# Patient Record
Sex: Female | Born: 1955 | Race: White | Hispanic: No | Marital: Married | Smoking: Former smoker
Health system: Southern US, Community
[De-identification: ages and names within clinical notes are randomized; demographics above are authoritative.]

## PROBLEM LIST (undated history)

## (undated) DIAGNOSIS — M549 Dorsalgia, unspecified: Secondary | ICD-10-CM

## (undated) DIAGNOSIS — K8020 Calculus of gallbladder without cholecystitis without obstruction: Secondary | ICD-10-CM

## (undated) DIAGNOSIS — F329 Major depressive disorder, single episode, unspecified: Secondary | ICD-10-CM

## (undated) DIAGNOSIS — D649 Anemia, unspecified: Secondary | ICD-10-CM

## (undated) DIAGNOSIS — E119 Type 2 diabetes mellitus without complications: Secondary | ICD-10-CM

## (undated) DIAGNOSIS — H269 Unspecified cataract: Secondary | ICD-10-CM

## (undated) DIAGNOSIS — K769 Liver disease, unspecified: Secondary | ICD-10-CM

## (undated) DIAGNOSIS — F32A Depression, unspecified: Secondary | ICD-10-CM

## (undated) DIAGNOSIS — N289 Disorder of kidney and ureter, unspecified: Secondary | ICD-10-CM

## (undated) DIAGNOSIS — H3320 Serous retinal detachment, unspecified eye: Secondary | ICD-10-CM

## (undated) DIAGNOSIS — E785 Hyperlipidemia, unspecified: Secondary | ICD-10-CM

## (undated) DIAGNOSIS — I10 Essential (primary) hypertension: Secondary | ICD-10-CM

## (undated) HISTORY — PX: EYE SURGERY: SHX253

## (undated) HISTORY — PX: ABDOMINAL HYSTERECTOMY: SHX81

## (undated) HISTORY — PX: ANTERIOR CRUCIATE LIGAMENT REPAIR: SHX115

## (undated) HISTORY — PX: KIDNEY STONE SURGERY: SHX686

---

## 2004-12-01 ENCOUNTER — Ambulatory Visit: Payer: Self-pay | Admitting: Surgery

## 2004-12-29 ENCOUNTER — Ambulatory Visit: Payer: Self-pay | Admitting: Surgery

## 2005-11-20 HISTORY — PX: BREAST BIOPSY: SHX20

## 2007-11-23 ENCOUNTER — Ambulatory Visit: Payer: Self-pay | Admitting: Family Medicine

## 2007-11-28 ENCOUNTER — Ambulatory Visit: Payer: Self-pay | Admitting: Family Medicine

## 2007-12-23 ENCOUNTER — Emergency Department: Payer: Self-pay | Admitting: Emergency Medicine

## 2008-01-02 ENCOUNTER — Ambulatory Visit: Payer: Self-pay | Admitting: Urology

## 2008-01-16 ENCOUNTER — Ambulatory Visit: Payer: Self-pay | Admitting: Urology

## 2008-02-21 ENCOUNTER — Ambulatory Visit: Payer: Self-pay | Admitting: Urology

## 2008-02-24 ENCOUNTER — Ambulatory Visit: Payer: Self-pay | Admitting: Urology

## 2008-03-10 ENCOUNTER — Ambulatory Visit: Payer: Self-pay | Admitting: Urology

## 2008-06-01 ENCOUNTER — Ambulatory Visit: Payer: Self-pay | Admitting: Urology

## 2008-11-16 ENCOUNTER — Ambulatory Visit: Payer: Self-pay | Admitting: Urology

## 2008-11-19 ENCOUNTER — Ambulatory Visit: Payer: Self-pay | Admitting: Urology

## 2008-12-01 ENCOUNTER — Ambulatory Visit: Payer: Self-pay | Admitting: Family Medicine

## 2009-12-23 ENCOUNTER — Ambulatory Visit: Payer: Self-pay | Admitting: Family Medicine

## 2012-02-01 ENCOUNTER — Ambulatory Visit: Payer: Self-pay | Admitting: Family Medicine

## 2013-03-12 ENCOUNTER — Ambulatory Visit: Payer: Self-pay

## 2014-04-30 ENCOUNTER — Ambulatory Visit: Payer: Self-pay

## 2015-06-14 ENCOUNTER — Other Ambulatory Visit: Payer: Self-pay | Admitting: Family Medicine

## 2015-06-14 DIAGNOSIS — Z1231 Encounter for screening mammogram for malignant neoplasm of breast: Secondary | ICD-10-CM

## 2015-06-18 ENCOUNTER — Ambulatory Visit
Admission: RE | Admit: 2015-06-18 | Discharge: 2015-06-18 | Disposition: A | Discharge status: Home / Self Care | Payer: BC Managed Care – PPO | Source: Ambulatory Visit | Attending: Family Medicine | Admitting: Family Medicine

## 2015-06-18 DIAGNOSIS — Z1231 Encounter for screening mammogram for malignant neoplasm of breast: Secondary | ICD-10-CM | POA: Insufficient documentation

## 2015-06-23 ENCOUNTER — Other Ambulatory Visit: Payer: Self-pay | Admitting: Family Medicine

## 2015-06-23 DIAGNOSIS — N6489 Other specified disorders of breast: Secondary | ICD-10-CM

## 2015-06-23 DIAGNOSIS — R928 Other abnormal and inconclusive findings on diagnostic imaging of breast: Secondary | ICD-10-CM

## 2015-07-01 ENCOUNTER — Ambulatory Visit
Admission: RE | Admit: 2015-07-01 | Discharge: 2015-07-01 | Disposition: A | Discharge status: Home / Self Care | Payer: BC Managed Care – PPO | Source: Ambulatory Visit | Attending: Family Medicine | Admitting: Family Medicine

## 2015-07-01 DIAGNOSIS — R928 Other abnormal and inconclusive findings on diagnostic imaging of breast: Secondary | ICD-10-CM

## 2015-07-01 DIAGNOSIS — N6489 Other specified disorders of breast: Secondary | ICD-10-CM

## 2015-07-01 DIAGNOSIS — N63 Unspecified lump in breast: Secondary | ICD-10-CM | POA: Diagnosis not present

## 2015-12-02 ENCOUNTER — Encounter: Payer: Self-pay | Admitting: *Deleted

## 2015-12-02 ENCOUNTER — Ambulatory Visit
Admission: EM | Admit: 2015-12-02 | Discharge: 2015-12-02 | Disposition: A | Discharge status: Home / Self Care | Payer: BC Managed Care – PPO | Attending: Family Medicine | Admitting: Family Medicine

## 2015-12-02 DIAGNOSIS — K8020 Calculus of gallbladder without cholecystitis without obstruction: Secondary | ICD-10-CM

## 2015-12-02 DIAGNOSIS — K529 Noninfective gastroenteritis and colitis, unspecified: Secondary | ICD-10-CM | POA: Diagnosis not present

## 2015-12-02 HISTORY — DX: Serous retinal detachment, unspecified eye: H33.20

## 2015-12-02 HISTORY — DX: Type 2 diabetes mellitus without complications: E11.9

## 2015-12-02 HISTORY — DX: Liver disease, unspecified: K76.9

## 2015-12-02 HISTORY — DX: Essential (primary) hypertension: I10

## 2015-12-02 HISTORY — DX: Calculus of gallbladder without cholecystitis without obstruction: K80.20

## 2015-12-02 HISTORY — DX: Depression, unspecified: F32.A

## 2015-12-02 HISTORY — DX: Unspecified cataract: H26.9

## 2015-12-02 HISTORY — DX: Dorsalgia, unspecified: M54.9

## 2015-12-02 HISTORY — DX: Disorder of kidney and ureter, unspecified: N28.9

## 2015-12-02 HISTORY — DX: Anemia, unspecified: D64.9

## 2015-12-02 HISTORY — DX: Major depressive disorder, single episode, unspecified: F32.9

## 2015-12-02 HISTORY — DX: Hyperlipidemia, unspecified: E78.5

## 2015-12-02 LAB — CBC WITH DIFFERENTIAL/PLATELET
Basophils Absolute: 0 10*3/uL (ref 0–0.1)
Basophils Relative: 1 %
Eosinophils Absolute: 0.1 10*3/uL (ref 0–0.7)
Eosinophils Relative: 3 %
HCT: 31.4 % — ABNORMAL LOW (ref 35.0–47.0)
Hemoglobin: 10.2 g/dL — ABNORMAL LOW (ref 12.0–16.0)
Lymphocytes Relative: 28 %
Lymphs Abs: 1.2 10*3/uL (ref 1.0–3.6)
MCH: 26.2 pg (ref 26.0–34.0)
MCHC: 32.5 g/dL (ref 32.0–36.0)
MCV: 80.7 fL (ref 80.0–100.0)
Monocytes Absolute: 0.4 10*3/uL (ref 0.2–0.9)
Monocytes Relative: 10 %
Neutro Abs: 2.5 10*3/uL (ref 1.4–6.5)
Neutrophils Relative %: 58 %
Platelets: 161 10*3/uL (ref 150–440)
RBC: 3.89 MIL/uL (ref 3.80–5.20)
RDW: 16.9 % — ABNORMAL HIGH (ref 11.5–14.5)
WBC: 4.3 10*3/uL (ref 3.6–11.0)

## 2015-12-02 LAB — COMPREHENSIVE METABOLIC PANEL
ALT: 47 U/L (ref 14–54)
AST: 105 U/L — ABNORMAL HIGH (ref 15–41)
Albumin: 3 g/dL — ABNORMAL LOW (ref 3.5–5.0)
Alkaline Phosphatase: 154 U/L — ABNORMAL HIGH (ref 38–126)
Anion gap: 10 (ref 5–15)
BUN: 12 mg/dL (ref 6–20)
CO2: 24 mmol/L (ref 22–32)
Calcium: 9 mg/dL (ref 8.9–10.3)
Chloride: 105 mmol/L (ref 101–111)
Creatinine, Ser: 0.68 mg/dL (ref 0.44–1.00)
GFR calc Af Amer: >60 mL/min (ref >60)
GFR calc non Af Amer: >60 mL/min (ref >60)
Glucose, Bld: 253 mg/dL — ABNORMAL HIGH (ref 65–99)
Potassium: 3.3 mmol/L — ABNORMAL LOW (ref 3.5–5.1)
Sodium: 139 mmol/L (ref 135–145)
Total Bilirubin: 1.1 mg/dL (ref 0.3–1.2)
Total Protein: 8.2 g/dL — ABNORMAL HIGH (ref 6.5–8.1)

## 2015-12-02 LAB — LIPASE, BLOOD: Lipase: 36 U/L (ref 11–51)

## 2015-12-02 LAB — AMYLASE: Amylase: 39 U/L (ref 28–100)

## 2015-12-02 MED ORDER — ONDANSETRON 4 MG PO TBDP: 4.0000 mg | ORAL_TABLET | Freq: Three times a day (TID) | ORAL | Status: DC | PRN

## 2015-12-02 NOTE — ED Notes (Signed)
Patient started having severe symptoms of nausea vomiting diarrhea and epigastric pain one week ago. Patient reports having this same pain off and on for over a month. History of liver disease, gallbladder still present. Additional symptom of skin rash on neck and arms. The rash is not itchy or painful.

## 2015-12-02 NOTE — Discharge Instructions (Signed)

## 2015-12-02 NOTE — ED Provider Notes (Signed)
CSN: [id number]     Arrival date & time 12/02/15  [id number] History   First MD Initiated Contact with Patient 12/02/15 1211     Chief Complaint  Patient presents with  . Diarrhea  . Emesis   (Consider location/radiation/quality/duration/timing/severity/associated sxs/prior Treatment) HPI    This a 60 year old female who presents with a severe diarrhea vomiting and epigastric pain for 1 week. He has had the same pain off and on for 5 months. He states that typically the pain will last for a week and then be okay for a week. The epigastric pain will come and go usually after a fatty meal. She states that the most of diarrhea she's had his been 10 bowel movements per day. She hasn't noticed any blood or mucus in the stool. She vomited once last night which is the first time in the last 2 weeks. Although although the pain is present all the time it does very in intensity. Done extensive review of [facility] and she had a workup for the same problem in October 2016. At that time an ultrasound of gallbladder showed cholelithiasis without evidence of cholecystitis. She had splenomegaly and a markedly heterogeneous and nodular liver consistent with cirrhosis. Recommend that she undergo MRI but she has never had that. She states that she is unable to keep anything down and has an immediate attack of diarrhea anytime she tries to eat. He is not able to keep down any liquids because of immediate vomiting. She does not look toxic at all is not been running a fever or having chills.  Past Medical History  Diagnosis Date  . Depression   . Calculus of gallbladder   . Renal disorder   . Diabetes mellitus without complication (HCC)   . Liver disease   . Hypertension   . Anemia   . Back pain   . Retinal detachment   . Hyperlipemia   . Cataract    Past Surgical History  Procedure Laterality Date  . Breast biopsy Right 2007    negative  . Abdominal hysterectomy    . Eye surgery    .  Anterior cruciate ligament repair Left   . Kidney stone surgery     Family History  Problem Relation Age of Onset  . Hypertension Mother   . CAD Mother   . Diabetes Father    Social History  Substance Use Topics  . Smoking status: Former Games developermoker  . Smokeless tobacco: Never Used  . Alcohol Use: Yes     Comment: social   OB History    No data available     Review of Systems  Constitutional: Positive for activity change. Negative for fever, chills, appetite change and fatigue.  Gastrointestinal: Positive for nausea, vomiting, abdominal pain and diarrhea.  All other systems reviewed and are negative.   Allergies  Sulfa antibiotics  Home Medications   Prior to Admission medications   Medication Sig Start Date End Date Taking? Authorizing Provider  albuterol (PROVENTIL HFA;VENTOLIN HFA) 108 (90 Base) MCG/ACT inhaler Inhale 2 puffs into the lungs every 6 (six) hours as needed for wheezing or shortness of breath.   Yes Historical Provider, MD  aspirin 81 MG tablet Take 81 mg by mouth daily.   Yes Historical Provider, MD  enalapril (VASOTEC) 2.5 MG tablet Take 2.5 mg by mouth daily.   Yes Historical Provider, MD  gabapentin (NEURONTIN) 300 MG capsule Take 300 mg by mouth 2 (two) times daily.   Yes  Historical Provider, MD  Insulin Glargine (LANTUS SOLOSTAR) 100 UNIT/ML Solostar Pen Inject into the skin daily at 10 pm.   Yes Historical Provider, MD  Liraglutide (VICTOZA) 18 MG/3ML SOPN Inject into the skin.   Yes Historical Provider, MD  metFORMIN (GLUCOPHAGE) 1000 MG tablet Take 1,000 mg by mouth 2 (two) times daily with a meal.   Yes Historical Provider, MD  montelukast (SINGULAIR) 10 MG tablet Take 10 mg by mouth at bedtime.   Yes Historical Provider, MD  Multiple Vitamin (MULTIVITAMIN) capsule Take 1 capsule by mouth daily.   Yes Historical Provider, MD  omega-3 fish oil (MAXEPA) 1000 MG CAPS capsule Take 1 capsule by mouth daily.   Yes Historical Provider, MD  omeprazole  (PRILOSEC) 40 MG capsule Take 40 mg by mouth daily.   Yes Historical Provider, MD  rosuvastatin (CRESTOR) 20 MG tablet Take 20 mg by mouth daily.   Yes Historical Provider, MD  sertraline (ZOLOFT) 100 MG tablet Take 100 mg by mouth daily.   Yes Historical Provider, MD  fluticasone (FLONASE) 50 MCG/ACT nasal spray Place 2 sprays into both nostrils daily.    Historical Provider, MD  ipratropium-albuterol (DUONEB) 0.5-2.5 (3) MG/3ML SOLN Take 3 mLs by nebulization every 4 (four) hours as needed.    Historical Provider, MD  ondansetron (ZOFRAN ODT) 4 MG disintegrating tablet Take 1 tablet (4 mg total) by mouth every 8 (eight) hours as needed for nausea or vomiting. 12/02/15   Lutricia Feil, PA-C   Meds Ordered and Administered this Visit  Medications - No data to display  BP 124/53 mmHg  Pulse 97  Temp(Src) 98.2 F (36.8 C) (Oral)  Resp 18  Ht 5\' 3"  (1.6 m)  Wt 210 lb (95.255 kg)  BMI 37.21 kg/m2  SpO2 98% No data found.   Physical Exam  Constitutional: She is oriented to person, place, and time. She appears well-developed and well-nourished. No distress.  HENT:  Head: Normocephalic and atraumatic.  Right Ear: External ear normal.  Left Ear: External ear normal.  Eyes: Conjunctivae are normal. Pupils are equal, round, and reactive to light.  Neck: Normal range of motion. Neck supple.  Abdominal: Soft. Bowel sounds are normal. She exhibits no distension and no mass. There is tenderness. There is no rebound and no guarding.  Examination of the abdomen shows the patient to be obese. Bowel sounds are present and normal. There is no rebound and no guarding present. She is tender most prominently in the mid epigastric region and also in the right upper quadrant. There is a positive Murphy sign with deep palpation.  Neurological: She is alert and oriented to person, place, and time.  Skin: Skin is warm and dry. She is not diaphoretic.  Psychiatric: She has a normal mood and affect. Her  behavior is normal. Judgment and thought content normal.  Nursing note and vitals reviewed.   ED Course  Procedures (including critical care time)  Labs Review Labs Reviewed  CBC WITH DIFFERENTIAL/PLATELET - Abnormal; Notable for the following:    Hemoglobin 10.2 (*)    HCT 31.4 (*)    RDW 16.9 (*)    All other components within normal limits  COMPREHENSIVE METABOLIC PANEL - Abnormal; Notable for the following:    Potassium 3.3 (*)    Glucose, Bld 253 (*)    Total Protein 8.2 (*)    Albumin 3.0 (*)    AST 105 (*)    Alkaline Phosphatase 154 (*)    All other components  within normal limits  AMYLASE  LIPASE, BLOOD  CBG MONITORING, ED    Imaging Review No results found.   Visual Acuity Review  Right Eye Distance:   Left Eye Distance:   Bilateral Distance:    Right Eye Near:   Left Eye Near:    Bilateral Near:     Extensive review of her previous medical records on this patient showed an actual improvement in her lab values specifically lipase CBC and CMP.    MDM   1. Postprandial diarrhea   2. Cholelithiasis without cholecystitis    New Prescriptions   ONDANSETRON (ZOFRAN ODT) 4 MG DISINTEGRATING TABLET    Take 1 tablet (4 mg total) by mouth every 8 (eight) hours as needed for nausea or vomiting.  Plan: 1. Test/x-ray results and diagnosis reviewed with patient 2. rx as per orders; risks, benefits, potential side effects reviewed with patient 3. Recommend supportive treatment with continued fluids and increase potassium foods if possible. I have  impressed upon her that she needs to follow-up with Dr. Augustine Radar soon as possible for consideration of a cholecystectomy. She is not toxic today he has a benign abdominal exam is afebrile do not have any episodes of nausea vomiting in our office-he does not appear ill. However because of the waxing and waning of her symptoms it is imperative that she seek surgical consultation.  4. F/u prn if symptoms worsen or don't  improve     Lutricia Feil, PA-C 12/02/15 1348

## 2016-03-22 ENCOUNTER — Emergency Department: Payer: BC Managed Care – PPO

## 2016-03-22 ENCOUNTER — Encounter: Payer: Self-pay | Admitting: Emergency Medicine

## 2016-03-22 ENCOUNTER — Inpatient Hospital Stay
Admission: EM | Admit: 2016-03-22 | Discharge: 2016-03-27 | DRG: 640 | Disposition: A | Discharge status: Home Health | Payer: BC Managed Care – PPO | Attending: Internal Medicine | Admitting: Internal Medicine

## 2016-03-22 DIAGNOSIS — F329 Major depressive disorder, single episode, unspecified: Secondary | ICD-10-CM | POA: Diagnosis present

## 2016-03-22 DIAGNOSIS — E119 Type 2 diabetes mellitus without complications: Secondary | ICD-10-CM | POA: Diagnosis present

## 2016-03-22 DIAGNOSIS — B9620 Unspecified Escherichia coli [E. coli] as the cause of diseases classified elsewhere: Secondary | ICD-10-CM | POA: Diagnosis present

## 2016-03-22 DIAGNOSIS — E785 Hyperlipidemia, unspecified: Secondary | ICD-10-CM | POA: Diagnosis present

## 2016-03-22 DIAGNOSIS — Z79899 Other long term (current) drug therapy: Secondary | ICD-10-CM | POA: Diagnosis not present

## 2016-03-22 DIAGNOSIS — Z882 Allergy status to sulfonamides status: Secondary | ICD-10-CM

## 2016-03-22 DIAGNOSIS — K7682 Hepatic encephalopathy: Secondary | ICD-10-CM | POA: Diagnosis present

## 2016-03-22 DIAGNOSIS — E876 Hypokalemia: Secondary | ICD-10-CM | POA: Diagnosis not present

## 2016-03-22 DIAGNOSIS — N3001 Acute cystitis with hematuria: Secondary | ICD-10-CM | POA: Diagnosis present

## 2016-03-22 DIAGNOSIS — I10 Essential (primary) hypertension: Secondary | ICD-10-CM | POA: Diagnosis present

## 2016-03-22 DIAGNOSIS — K7200 Acute and subacute hepatic failure without coma: Secondary | ICD-10-CM | POA: Diagnosis present

## 2016-03-22 DIAGNOSIS — Z7951 Long term (current) use of inhaled steroids: Secondary | ICD-10-CM

## 2016-03-22 DIAGNOSIS — Z87891 Personal history of nicotine dependence: Secondary | ICD-10-CM | POA: Diagnosis not present

## 2016-03-22 DIAGNOSIS — E860 Dehydration: Secondary | ICD-10-CM | POA: Diagnosis present

## 2016-03-22 DIAGNOSIS — K7460 Unspecified cirrhosis of liver: Secondary | ICD-10-CM | POA: Diagnosis present

## 2016-03-22 DIAGNOSIS — R531 Weakness: Secondary | ICD-10-CM | POA: Diagnosis not present

## 2016-03-22 DIAGNOSIS — K7290 Hepatic failure, unspecified without coma: Secondary | ICD-10-CM

## 2016-03-22 DIAGNOSIS — Z7982 Long term (current) use of aspirin: Secondary | ICD-10-CM

## 2016-03-22 DIAGNOSIS — Z794 Long term (current) use of insulin: Secondary | ICD-10-CM | POA: Diagnosis not present

## 2016-03-22 LAB — CBC
HEMATOCRIT: 27 % — AB (ref 35.0–47.0)
HEMATOCRIT: 25.8 % — AB (ref 35.0–47.0)
Hemoglobin: 9 g/dL — ABNORMAL LOW (ref 12.0–16.0)
Hemoglobin: 8.8 g/dL — ABNORMAL LOW (ref 12.0–16.0)
MCH: 26.9 pg (ref 26.0–34.0)
MCH: 27.2 pg (ref 26.0–34.0)
MCHC: 33.3 g/dL (ref 32.0–36.0)
MCHC: 33.9 g/dL (ref 32.0–36.0)
MCV: 80.8 fL (ref 80.0–100.0)
MCV: 80.2 fL (ref 80.0–100.0)
Platelets: 148 10*3/uL — ABNORMAL LOW (ref 150–440)
Platelets: 138 10*3/uL — ABNORMAL LOW (ref 150–440)
RBC: 3.34 MIL/uL — ABNORMAL LOW (ref 3.80–5.20)
RBC: 3.22 MIL/uL — ABNORMAL LOW (ref 3.80–5.20)
RDW: 19 % — AB (ref 11.5–14.5)
RDW: 18.8 % — AB (ref 11.5–14.5)
WBC: 8.8 10*3/uL (ref 3.6–11.0)
WBC: 8.5 10*3/uL (ref 3.6–11.0)

## 2016-03-22 LAB — COMPREHENSIVE METABOLIC PANEL
ALBUMIN: 2.6 g/dL — AB (ref 3.5–5.0)
ALK PHOS: 160 U/L — AB (ref 38–126)
ALT: 47 U/L (ref 14–54)
AST: 300 U/L — AB (ref 15–41)
Anion gap: 14 (ref 5–15)
BILIRUBIN TOTAL: 2 mg/dL — AB (ref 0.3–1.2)
BUN: 23 mg/dL — AB (ref 6–20)
CHLORIDE: 102 mmol/L (ref 101–111)
CO2: 23 mmol/L (ref 22–32)
Calcium: 9.7 mg/dL (ref 8.9–10.3)
Creatinine, Ser: 1.11 mg/dL — ABNORMAL HIGH (ref 0.44–1.00)
GFR calc Af Amer: >60 mL/min (ref >60)
GFR calc non Af Amer: 53 mL/min — ABNORMAL LOW (ref >60)
GLUCOSE: 166 mg/dL — AB (ref 65–99)
POTASSIUM: 2.6 mmol/L — AB (ref 3.5–5.1)
Sodium: 139 mmol/L (ref 135–145)
TOTAL PROTEIN: 8.6 g/dL — AB (ref 6.5–8.1)

## 2016-03-22 LAB — URINALYSIS COMPLETE WITH MICROSCOPIC (ARMC ONLY)
Bilirubin Urine: NEGATIVE
Glucose, UA: NEGATIVE mg/dL
Ketones, ur: NEGATIVE mg/dL
Nitrite: NEGATIVE
PROTEIN: 100 mg/dL — AB
SPECIFIC GRAVITY, URINE: 1.019 (ref 1.005–1.030)
SQUAMOUS EPITHELIAL / LPF: NONE SEEN
pH: 5 (ref 5.0–8.0)

## 2016-03-22 LAB — PROTIME-INR
INR: 1.31
Prothrombin Time: 16.4 seconds — ABNORMAL HIGH (ref 11.4–15.0)

## 2016-03-22 LAB — GLUCOSE, CAPILLARY
GLUCOSE-CAPILLARY: 227 mg/dL — AB (ref 65–99)
Glucose-Capillary: 145 mg/dL — ABNORMAL HIGH (ref 65–99)
Glucose-Capillary: 232 mg/dL — ABNORMAL HIGH (ref 65–99)

## 2016-03-22 LAB — CREATININE, SERUM
Creatinine, Ser: 1.04 mg/dL — ABNORMAL HIGH (ref 0.44–1.00)
GFR calc non Af Amer: 57 mL/min — ABNORMAL LOW (ref >60)

## 2016-03-22 LAB — MAGNESIUM: Magnesium: 1.8 mg/dL (ref 1.7–2.4)

## 2016-03-22 LAB — POTASSIUM: POTASSIUM: 3.1 mmol/L — AB (ref 3.5–5.1)

## 2016-03-22 LAB — AMMONIA: Ammonia: 81 umol/L — ABNORMAL HIGH (ref 9–35)

## 2016-03-22 MED ORDER — DEXTROSE 5 % IV SOLN
1.0000 g | Freq: Once | INTRAVENOUS | Status: AC
  Administered 2016-03-22: 1 g via INTRAVENOUS
  Filled 2016-03-22: qty 10

## 2016-03-22 MED ORDER — ONDANSETRON HCL 4 MG PO TABS: 4.0000 mg | ORAL_TABLET | Freq: Four times a day (QID) | ORAL | Status: DC | PRN

## 2016-03-22 MED ORDER — LACTULOSE 10 GM/15ML PO SOLN
30.0000 g | Freq: Three times a day (TID) | ORAL | Status: DC
  Administered 2016-03-22 – 2016-03-27 (×15): 30 g via ORAL
  Filled 2016-03-22 (×16): qty 60

## 2016-03-22 MED ORDER — POTASSIUM CHLORIDE IN NACL 20-0.9 MEQ/L-% IV SOLN
INTRAVENOUS | Status: DC
  Administered 2016-03-22 – 2016-03-25 (×4): via INTRAVENOUS
  Filled 2016-03-22 (×9): qty 1000

## 2016-03-22 MED ORDER — ALBUTEROL SULFATE (2.5 MG/3ML) 0.083% IN NEBU: 2.5000 mg | INHALATION_SOLUTION | Freq: Four times a day (QID) | RESPIRATORY_TRACT | Status: DC | PRN

## 2016-03-22 MED ORDER — MAGNESIUM SULFATE 2 GM/50ML IV SOLN
2.0000 g | Freq: Once | INTRAVENOUS | Status: DC
  Filled 2016-03-22: qty 50

## 2016-03-22 MED ORDER — SERTRALINE HCL 100 MG PO TABS
100.0000 mg | ORAL_TABLET | Freq: Every day | ORAL | Status: DC
  Administered 2016-03-22 – 2016-03-27 (×6): 100 mg via ORAL
  Filled 2016-03-22 (×6): qty 1

## 2016-03-22 MED ORDER — ALBUTEROL SULFATE HFA 108 (90 BASE) MCG/ACT IN AERS: 2.0000 | INHALATION_SPRAY | Freq: Four times a day (QID) | RESPIRATORY_TRACT | Status: DC | PRN

## 2016-03-22 MED ORDER — RIFAXIMIN 550 MG PO TABS
550.0000 mg | ORAL_TABLET | Freq: Two times a day (BID) | ORAL | Status: DC
  Administered 2016-03-22 – 2016-03-27 (×11): 550 mg via ORAL
  Filled 2016-03-22 (×11): qty 1

## 2016-03-22 MED ORDER — CETYLPYRIDINIUM CHLORIDE 0.05 % MT LIQD
7.0000 mL | Freq: Two times a day (BID) | OROMUCOSAL | Status: DC
  Administered 2016-03-22 – 2016-03-26 (×8): 7 mL via OROMUCOSAL

## 2016-03-22 MED ORDER — ONDANSETRON HCL 4 MG/2ML IJ SOLN: 4.0000 mg | Freq: Four times a day (QID) | INTRAMUSCULAR | Status: DC | PRN

## 2016-03-22 MED ORDER — ADULT MULTIVITAMIN W/MINERALS CH
1.0000 | ORAL_TABLET | Freq: Every day | ORAL | Status: DC
  Administered 2016-03-24 – 2016-03-27 (×4): 1 via ORAL
  Filled 2016-03-22 (×5): qty 1

## 2016-03-22 MED ORDER — SODIUM CHLORIDE 0.9% FLUSH
3.0000 mL | Freq: Two times a day (BID) | INTRAVENOUS | Status: DC
  Administered 2016-03-22 – 2016-03-26 (×6): 3 mL via INTRAVENOUS

## 2016-03-22 MED ORDER — ENOXAPARIN SODIUM 40 MG/0.4ML ~~LOC~~ SOLN
40.0000 mg | SUBCUTANEOUS | Status: DC
  Administered 2016-03-22 – 2016-03-27 (×6): 40 mg via SUBCUTANEOUS
  Filled 2016-03-22 (×6): qty 0.4

## 2016-03-22 MED ORDER — POTASSIUM CHLORIDE 10 MEQ/100ML IV SOLN
10.0000 meq | INTRAVENOUS | Status: AC
  Administered 2016-03-22 (×4): 10 meq via INTRAVENOUS
  Filled 2016-03-22 (×6): qty 100

## 2016-03-22 MED ORDER — TIOTROPIUM BROMIDE MONOHYDRATE 18 MCG IN CAPS
18.0000 ug | ORAL_CAPSULE | Freq: Every day | RESPIRATORY_TRACT | Status: DC
  Administered 2016-03-24 – 2016-03-27 (×3): 18 ug via RESPIRATORY_TRACT
  Filled 2016-03-22: qty 5

## 2016-03-22 MED ORDER — IPRATROPIUM-ALBUTEROL 0.5-2.5 (3) MG/3ML IN SOLN: 3.0000 mL | RESPIRATORY_TRACT | Status: DC | PRN

## 2016-03-22 MED ORDER — FLUTICASONE PROPIONATE 50 MCG/ACT NA SUSP
2.0000 | Freq: Every day | NASAL | Status: DC
  Administered 2016-03-24 – 2016-03-27 (×4): 2 via NASAL
  Filled 2016-03-22: qty 16

## 2016-03-22 MED ORDER — DEXTROSE 5 % IV SOLN
1.0000 g | INTRAVENOUS | Status: DC
  Administered 2016-03-23 – 2016-03-25 (×3): 1 g via INTRAVENOUS
  Filled 2016-03-22 (×3): qty 10

## 2016-03-22 NOTE — Progress Notes (Signed)
Patient ID: JESI JURGENS, female   DOB: 10/26/1956, 60 y.o.   MRN: [id number] [facility] PROGRESS NOTE  Crystal Pratt:[id number] DOB: 30-Sep-1956 DOA: 03/22/2016 PCP: Donnamae Jude, MD  HPI/Subjective: Patient is awake but unable to talk to me at this point. She is moving all of her extremities. Husband at the bedside and able to give history.  Objective: Filed Vitals:   03/22/16 0815 03/22/16 0925  BP:  126/45  Pulse: 113 103  Temp:  99.6 F (37.6 C)  Resp: 19     Filed Weights   03/22/16 0333 03/22/16 0925  Weight: 95.255 kg (210 lb) 75.705 kg (166 lb 14.4 oz)    ROS: Review of Systems  Unable to perform ROS Unable to provide review of systems secondary to altered mental status Exam: Physical Exam  Constitutional: She appears lethargic.  HENT:  Nose: No mucosal edema.  Mouth/Throat: No oropharyngeal exudate or posterior oropharyngeal edema.  Eyes: Conjunctivae and lids are normal. Pupils are equal, round, and reactive to light.  Neck: No JVD present. Carotid bruit is not present. No edema present. No thyroid mass and no thyromegaly present.  Cardiovascular: S1 normal and S2 normal.  Exam reveals no gallop.   Murmur heard.  Systolic murmur is present with a grade of 2/6  Pulses:      Dorsalis pedis pulses are 2+ on the right side, and 2+ on the left side.  Respiratory: No respiratory distress. She has no wheezes. She has no rhonchi. She has no rales.  GI: Soft. Bowel sounds are normal. She exhibits distension. There is no tenderness.  Musculoskeletal:       Right ankle: She exhibits swelling.       Left ankle: She exhibits swelling.  Lymphadenopathy:    She has no cervical adenopathy.  Neurological: She appears lethargic.  Patient moves her extremities on her own. Unable to follow commands at this time.  Skin: Skin is warm. Nails show no clubbing.  Spider angiomas over neck and upper chest and face.  Psychiatric:  Unable to assess secondary to  altered mental status      Data Reviewed: Basic Metabolic Panel:  Recent Labs Lab 03/22/16 0352 03/22/16 0952  NA 139  --   K 2.6*  --   CL 102  --   CO2 23  --   GLUCOSE 166*  --   BUN 23*  --   CREATININE 1.11* 1.04*  CALCIUM 9.7  --   MG 1.8  --    Liver Function Tests:  Recent Labs Lab 03/22/16 0352  AST 300*  ALT 47  ALKPHOS 160*  BILITOT 2.0*  PROT 8.6*  ALBUMIN 2.6*    Recent Labs Lab 03/22/16 0352  AMMONIA 81*   CBC:  Recent Labs Lab 03/22/16 0352 03/22/16 0952  WBC 8.8 8.5  HGB 9.0* 8.8*  HCT 27.0* 25.8*  MCV 80.8 80.2  PLT 148* 138*     CBG:  Recent Labs Lab 03/22/16 0943  GLUCAP 145*    Studies: Ct Head Wo Contrast  03/22/2016  CLINICAL DATA:  Syncope and fall.  Increased weakness. EXAM: CT HEAD WITHOUT CONTRAST TECHNIQUE: Contiguous axial images were obtained from the base of the skull through the vertex without intravenous contrast. COMPARISON:  None. FINDINGS: Ventricles and sulci are symmetrical. No mass effect or midline shift. No abnormal extra-axial fluid collections. Gray-white matter junctions are distinct. Basal cisterns are not effaced. No evidence of acute intracranial hemorrhage. No depressed skull fractures.  Visualized paranasal sinuses and mastoid air cells are not opacified. Vascular calcifications. IMPRESSION: No acute intracranial abnormalities. Electronically Signed   By: Burman NievesWilliam  Stevens M.D.   On: 03/22/2016 06:34    Scheduled Meds: . antiseptic oral rinse  7 mL Mouth Rinse BID  . cefTRIAXone (ROCEPHIN)  IV  1 g Intravenous Q24H  . enoxaparin (LOVENOX) injection  40 mg Subcutaneous Q24H  . fluticasone  2 spray Each Nare Daily  . lactulose  30 g Oral TID  . magnesium sulfate 1 - 4 g bolus IVPB  2 g Intravenous Once  . multivitamin with minerals  1 tablet Oral Daily  . rifaximin  550 mg Oral BID  . sertraline  100 mg Oral Daily  . sodium chloride flush  3 mL Intravenous Q12H  . tiotropium  18 mcg Inhalation  Daily   Continuous Infusions: . 0.9 % NaCl with KCl 20 mEq / L 75 mL/hr at 03/22/16 1002    Assessment/Plan:  1. Acute hepatic encephalopathy. Lactulose 3 times a day. If unable to take orally must give rectally. Start Xifaxan 550 mg twice a day. 2. Cirrhosis of the liver. [facility] cirrhosis of the liver is unspecified what type. 3. Type 2 diabetes mellitus. We'll check fingersticks and monitor closely 4. Severe hypokalemia potassium replacement IV and also in IV fluids. 5. Increased liver function tests likely secondary to cirrhosis 6. Acute cystitis with hematuria on Rocephin follow-up urine culture 7. Hypomagnesemia replace magnesium IV 8. History of depression continue Zoloft 9. Dehydration. Continue IV fluids and hold water pills at this point.  Code Status:     Code Status Orders        Start     Ordered   03/22/16 0924  Full code   Continuous     03/22/16 0924    Code Status History    Date Active Date Inactive Code Status Order ID Comments User Context   This patient has a current code status but no historical code status.     Family Communication: Husband at the bedside Disposition Plan: To be determined  Antibiotics:  rocephin  Time spent: 35 minutes  Alford HighlandWIETING, Venus Ruhe  [employer]

## 2016-03-22 NOTE — H&P (Signed)
[facility] [place] [facility]   PATIENT NAME: Crystal PateeDebra Pratt    MR#:  [id number]  DATE OF BIRTH:  October 01, 1956  DATE OF ADMISSION:  03/22/2016  PRIMARY CARE PHYSICIAN: Donnamae Jude'MEARA, CHRISTINE A, MD   REQUESTING/REFERRING PHYSICIAN:   CHIEF COMPLAINT:   Chief Complaint  Patient presents with  . Fall  . Weakness    HISTORY OF PRESENT ILLNESS: Crystal PateeDebra Pratt  is a 60 y.o. female with a known history of liver disease, diabetes mellitus, hypertension, hyperlipidemia was brought by the family member because of confusion since yesterday. Patient has been confused and lethargic since yesterday. She's been disoriented to time place and person. Patient was evaluated in the emergency room her ammonia level is high. She was treated in the past for hepatic encephalopathy and elevated ammonia. Patient also had a fall pending CT scan of the head in the emergency room. Patient is unable to give any history she is completely confused most of the history (from the family member at bedside. According to family member patient is compliant with her oral lactulose medication.  PAST MEDICAL HISTORY:   Past Medical History  Diagnosis Date  . Depression   . Calculus of gallbladder   . Renal disorder   . Diabetes mellitus without complication (HCC)   . Liver disease   . Hypertension   . Anemia   . Back pain   . Retinal detachment   . Hyperlipemia   . Cataract     PAST SURGICAL HISTORY: Past Surgical History  Procedure Laterality Date  . Breast biopsy Right 2007    negative  . Abdominal hysterectomy    . Eye surgery    . Anterior cruciate ligament repair Left   . Kidney stone surgery      SOCIAL HISTORY:  Social History  Substance Use Topics  . Smoking status: Former Games developermoker  . Smokeless tobacco: Never Used  . Alcohol Use: Yes     Comment: social    FAMILY HISTORY:  Family History  Problem Relation Age of Onset  . Hypertension Mother   . CAD Mother   . Diabetes  Father     DRUG ALLERGIES:  Allergies  Allergen Reactions  . Sulfa Antibiotics Rash    REVIEW OF SYSTEMS:  Could not be obtained as patient is disoriented to time,place and person.  MEDICATIONS AT HOME:  Prior to Admission medications   Medication Sig Start Date End Date Taking? Authorizing Provider  albuterol (PROVENTIL HFA;VENTOLIN HFA) 108 (90 Base) MCG/ACT inhaler Inhale 2 puffs into the lungs every 6 (six) hours as needed for wheezing or shortness of breath.   Yes Historical Provider, MD  aspirin 81 MG tablet Take 81 mg by mouth daily.   Yes Historical Provider, MD  enalapril (VASOTEC) 2.5 MG tablet Take 2.5 mg by mouth daily.   Yes Historical Provider, MD  fluticasone (FLONASE) 50 MCG/ACT nasal spray Place 2 sprays into both nostrils daily.   Yes Historical Provider, MD  furosemide (LASIX) 20 MG tablet Take 60 mg by mouth daily.   Yes Historical Provider, MD  gabapentin (NEURONTIN) 300 MG capsule Take 300 mg by mouth 2 (two) times daily.   Yes Historical Provider, MD  Insulin Degludec 200 UNIT/ML SOPN Inject 80 Units into the skin at bedtime.   Yes Historical Provider, MD  ipratropium-albuterol (DUONEB) 0.5-2.5 (3) MG/3ML SOLN Take 3 mLs by nebulization every 4 (four) hours as needed.   Yes Historical Provider, MD  lactulose (CHRONULAC) 10 GM/15ML  solution Take 10 g by mouth daily.   Yes Historical Provider, MD  Liraglutide (VICTOZA) 18 MG/3ML SOPN Inject into the skin.   Yes Historical Provider, MD  loratadine (CLARITIN) 10 MG tablet Take 10 mg by mouth daily.   Yes Historical Provider, MD  metFORMIN (GLUCOPHAGE) 1000 MG tablet Take 1,000 mg by mouth 2 (two) times daily with a meal.   Yes Historical Provider, MD  montelukast (SINGULAIR) 10 MG tablet Take 10 mg by mouth at bedtime.   Yes Historical Provider, MD  omega-3 fish oil (MAXEPA) 1000 MG CAPS capsule Take 1 capsule by mouth daily.   Yes Historical Provider, MD  omeprazole (PRILOSEC) 40 MG capsule Take 40 mg by mouth daily.    Yes Historical Provider, MD  potassium chloride SA (K-DUR,KLOR-CON) 20 MEQ tablet Take 10 mEq by mouth daily.   Yes Historical Provider, MD  rosuvastatin (CRESTOR) 20 MG tablet Take 20 mg by mouth daily.   Yes Historical Provider, MD  sertraline (ZOLOFT) 100 MG tablet Take 100 mg by mouth daily.   Yes Historical Provider, MD  spironolactone (ALDACTONE) 50 MG tablet Take 150 mg by mouth daily.   Yes Historical Provider, MD  tiotropium (SPIRIVA) 18 MCG inhalation capsule Place 18 mcg into inhaler and inhale daily.   Yes Historical Provider, MD  Insulin Glargine (LANTUS SOLOSTAR) 100 UNIT/ML Solostar Pen Inject into the skin daily at 10 pm.    Historical Provider, MD  Multiple Vitamin (MULTIVITAMIN) capsule Take 1 capsule by mouth daily.    Historical Provider, MD  ondansetron (ZOFRAN ODT) 4 MG disintegrating tablet Take 1 tablet (4 mg total) by mouth every 8 (eight) hours as needed for nausea or vomiting. 12/02/15   Lutricia Feil, PA-C      PHYSICAL EXAMINATION:   VITAL SIGNS: Blood pressure 133/48, pulse 109, temperature 97.7 F (36.5 C), temperature source Oral, resp. rate 11, height  (1.6 m), weight 95.255 kg (210 lb), SpO2 97 %.  GENERAL:  60 y.o.-year-old patient lying in the bed with mild distress.  EYES: Pupils equal, round, reactive to light and accommodation. No scleral icterus. Extraocular muscles intact.  HEENT: Head atraumatic, normocephalic. Oropharynx dry and nasopharynx clear.  NECK:  Supple, no jugular venous distention. No thyroid enlargement, no tenderness.  LUNGS: Normal breath sounds bilaterally, no wheezing, rales,rhonchi or crepitation. No use of accessory muscles of respiration.  CARDIOVASCULAR: S1, S2 normal. No murmurs, rubs, or gallops.  ABDOMEN: Soft, nontender, nondistended. Bowel sounds present. No organomegaly or mass.  EXTREMITIES: No pedal edema, cyanosis, or clubbing.  NEUROLOGIC: Not oriented to time,place and person. Confused. PSYCHIATRIC: could not  be assessed SKIN: No obvious rash, lesion, or ulcer.   LABORATORY PANEL:   CBC  Recent Labs Lab 03/22/16 0352  WBC 8.8  HGB 9.0*  HCT 27.0*  PLT 148*  MCV 80.8  MCH 26.9  MCHC 33.3  RDW 19.0*   ------------------------------------------------------------------------------------------------------------------  Chemistries   Recent Labs Lab 03/22/16 0352  NA 139  K 2.6*  CL 102  CO2 23  GLUCOSE 166*  BUN 23*  CREATININE 1.11*  CALCIUM 9.7  AST 300*  ALT 47  ALKPHOS 160*  BILITOT 2.0*   ------------------------------------------------------------------------------------------------------------------ estimated creatinine clearance is 59.2 mL/min (by C-G formula based on Cr of 1.11). ------------------------------------------------------------------------------------------------------------------ No results for input(s): TSH, T4TOTAL, T3FREE, THYROIDAB in the last 72 hours.  Invalid input(s): FREET3   Coagulation profile  Recent Labs Lab 03/22/16 0352  INR 1.31   ------------------------------------------------------------------------------------------------------------------- No results for input(s):  DDIMER in the last 72 hours. -------------------------------------------------------------------------------------------------------------------  Cardiac Enzymes No results for input(s): CKMB, TROPONINI, MYOGLOBIN in the last 168 hours.  Invalid input(s): CK ------------------------------------------------------------------------------------------------------------------ Invalid input(s): POCBNP  ---------------------------------------------------------------------------------------------------------------  Urinalysis    Component Value Date/Time   COLORURINE RED* 03/22/2016 0343   APPEARANCEUR TURBID* 03/22/2016 0343   LABSPEC 1.019 03/22/2016 0343   PHURINE 5.0 03/22/2016 0343   GLUCOSEU NEGATIVE 03/22/2016 0343   HGBUR 3+* 03/22/2016 0343    BILIRUBINUR NEGATIVE 03/22/2016 0343   KETONESUR NEGATIVE 03/22/2016 0343   PROTEINUR 100* 03/22/2016 0343   NITRITE NEGATIVE 03/22/2016 0343   LEUKOCYTESUR 3+* 03/22/2016 0343     RADIOLOGY: No results found.  EKG: Orders placed or performed in visit on 11/16/08  . EKG 12-Lead    IMPRESSION AND PLAN: 59 year old female patient with history of liver disease, diabetes mellitus, hypertension, hyperlipidemia presented to the emergency room with confusion and change in mental status. Patient's ammonia level is elevated. Admitting diagnosis 1. Altered mental status 2. Hepatic encephalopathy 3. Hypokalemia 4. Urinary tract infection 5. Advanced liver disease 6. Abnormal liver function tests Treatment plan Admit patient to telemetry Replace potassium intravenously Start patient on IV Rocephin antibiotic Follow-up liver function tests Follow-up CT scan of the head results Start patient on oral lactulose for elevated ammonia. Supportive care  All the records are reviewed and case discussed with ED provider. Management plans discussed with the patient, family and they are in agreement.  CODE STATUS:FULL Code Status History    This patient does not have a recorded code status. Please follow your organizational policy for patients in this situation.       TOTAL TIME TAKING CARE OF THIS PATIENT: 50 minutes.    Ihor Austin M.D on 03/22/2016 at 6:16 AM  Between 7am to 6pm - Pager - [phone]  After 6pm go to [email or web address] - password [email or web address] [facility]  [facility]  Office  [phone]  CC: Primary care physician; O'MEARA, CHRISTINE A, MD

## 2016-03-22 NOTE — ED Provider Notes (Addendum)
[facility] Emergency Department Provider Note  ____________________________________________  Time seen: 3:35 AM  I have reviewed the triage vital signs and the nursing notes.  History Limited secondary to altered mental status HISTORY  Chief Complaint Fall and Weakness      HPI Crystal Pratt is a 60 y.o. female with history of cirrhosis of the liver and hepatic encephalopathy presents via EMS with altered mental status. Per the patient's husband he returned home and found the patient on the floor with altered mental status.    Past Medical History  Diagnosis Date  . Depression   . Calculus of gallbladder   . Renal disorder   . Diabetes mellitus without complication (HCC)   . Liver disease   . Hypertension   . Anemia   . Back pain   . Retinal detachment   . Hyperlipemia   . Cataract     Patient Active Problem List   Diagnosis Date Noted  . Hepatic encephalopathy (HCC) 03/22/2016  . Hypokalemia 03/22/2016    Past Surgical History  Procedure Laterality Date  . Breast biopsy Right 2007    negative  . Abdominal hysterectomy    . Eye surgery    . Anterior cruciate ligament repair Left   . Kidney stone surgery      Current Outpatient Rx  Name  Route  Sig  Dispense  Refill  . albuterol (PROVENTIL HFA;VENTOLIN HFA) 108 (90 Base) MCG/ACT inhaler   Inhalation   Inhale 2 puffs into the lungs every 6 (six) hours as needed for wheezing or shortness of breath.         Marland Kitchen aspirin 81 MG tablet   Oral   Take 81 mg by mouth daily.         . enalapril (VASOTEC) 2.5 MG tablet   Oral   Take 2.5 mg by mouth daily.         . fluticasone (FLONASE) 50 MCG/ACT nasal spray   Each Nare   Place 2 sprays into both nostrils daily.         . furosemide (LASIX) 20 MG tablet   Oral   Take 60 mg by mouth daily.         Marland Kitchen gabapentin (NEURONTIN) 300 MG capsule   Oral   Take 300 mg by mouth 2 (two) times daily.         . Insulin Degludec 200  UNIT/ML SOPN   Subcutaneous   Inject 80 Units into the skin at bedtime.         Marland Kitchen ipratropium-albuterol (DUONEB) 0.5-2.5 (3) MG/3ML SOLN   Nebulization   Take 3 mLs by nebulization every 4 (four) hours as needed.         . lactulose (CHRONULAC) 10 GM/15ML solution   Oral   Take 10 g by mouth daily.         . Liraglutide (VICTOZA) 18 MG/3ML SOPN   Subcutaneous   Inject into the skin.         Marland Kitchen loratadine (CLARITIN) 10 MG tablet   Oral   Take 10 mg by mouth daily.         . metFORMIN (GLUCOPHAGE) 1000 MG tablet   Oral   Take 1,000 mg by mouth 2 (two) times daily with a meal.         . montelukast (SINGULAIR) 10 MG tablet   Oral   Take 10 mg by mouth at bedtime.         Marland Kitchen  omega-3 fish oil (MAXEPA) 1000 MG CAPS capsule   Oral   Take 1 capsule by mouth daily.         Marland Kitchen omeprazole (PRILOSEC) 40 MG capsule   Oral   Take 40 mg by mouth daily.         . potassium chloride SA (K-DUR,KLOR-CON) 20 MEQ tablet   Oral   Take 10 mEq by mouth daily.         . rosuvastatin (CRESTOR) 20 MG tablet   Oral   Take 20 mg by mouth daily.         . sertraline (ZOLOFT) 100 MG tablet   Oral   Take 100 mg by mouth daily.         Marland Kitchen spironolactone (ALDACTONE) 50 MG tablet   Oral   Take 150 mg by mouth daily.         Marland Kitchen tiotropium (SPIRIVA) 18 MCG inhalation capsule   Inhalation   Place 18 mcg into inhaler and inhale daily.         . Insulin Glargine (LANTUS SOLOSTAR) 100 UNIT/ML Solostar Pen   Subcutaneous   Inject into the skin daily at 10 pm.         . Multiple Vitamin (MULTIVITAMIN) capsule   Oral   Take 1 capsule by mouth daily.         . ondansetron (ZOFRAN ODT) 4 MG disintegrating tablet   Oral   Take 1 tablet (4 mg total) by mouth every 8 (eight) hours as needed for nausea or vomiting.   20 tablet   0     Allergies Sulfa antibiotics  Family History  Problem Relation Age of Onset  . Hypertension Mother   . CAD Mother   . Diabetes  Father     Social History Social History  Substance Use Topics  . Smoking status: Former Games developer  . Smokeless tobacco: Never Used  . Alcohol Use: Yes     Comment: social    Review of Systems  Constitutional: Negative for fever. Eyes: Negative for visual changes. ENT: Negative for sore throat. Cardiovascular: Negative for chest pain. Respiratory: Negative for shortness of breath. Gastrointestinal: Negative for abdominal pain, vomiting and diarrhea. Genitourinary: Negative for dysuria. Musculoskeletal: Negative for back pain. Skin: Negative for rash. Neurological: Negative for headaches, focal weakness or numbness. Positive for confusion   10-point ROS otherwise negative.  ____________________________________________   PHYSICAL EXAM:  VITAL SIGNS: ED Triage Vitals  Enc Vitals Group     BP 03/22/16 0333 119/73 mmHg     Pulse Rate 03/22/16 0333 106     Resp 03/22/16 0333 20     Temp 03/22/16 0333 97.7 F (36.5 C)     Temp Source 03/22/16 0333 Oral     SpO2 03/22/16 0333 100 %     Weight 03/22/16 0333 210 lb (95.255 kg)     Height 03/22/16 0333 5\' 3"  (1.6 m)     Head Cir --      Peak Flow --      Pain Score 03/22/16 0331 0     Pain Loc --      Pain Edu? --      Excl. in GC? --      Constitutional: Alert but confused Eyes: Conjunctivae are normal. PERRL. Normal extraocular movements.Positive for scleral icterus ENT   Head: Normocephalic and atraumatic.   Nose: No congestion/rhinnorhea.   Mouth/Throat: Mucous membranes are moist.   Neck: No stridor. Hematological/Lymphatic/Immunilogical: No cervical lymphadenopathy.  Cardiovascular: Normal rate, regular rhythm. Normal and symmetric distal pulses are present in all extremities. No murmurs, rubs, or gallops. Respiratory: Normal respiratory effort without tachypnea nor retractions. Breath sounds are clear and equal bilaterally. No wheezes/rales/rhonchi. Gastrointestinal: Soft and nontender. No  distention. There is no CVA tenderness. Genitourinary: deferred Musculoskeletal: Nontender with normal range of motion in all extremities. No joint effusions.  No lower extremity tenderness nor edema. Neurologic:  Normal speech and language. No gross focal neurologic deficits are appreciated. Speech is normal.  Skin:  Skin is warm, dry and intact. No rash noted.Positive for jaundice Psychiatric: Agitated and confused ____________________________________________    LABS (pertinent positives/negatives)  Labs Reviewed  COMPREHENSIVE METABOLIC PANEL - Abnormal; Notable for the following:    Potassium 2.6 (*)    Glucose, Bld 166 (*)    BUN 23 (*)    Creatinine, Ser 1.11 (*)    Total Protein 8.6 (*)    Albumin 2.6 (*)    AST 300 (*)    Alkaline Phosphatase 160 (*)    Total Bilirubin 2.0 (*)    GFR calc non Af Amer 53 (*)    All other components within normal limits  CBC - Abnormal; Notable for the following:    RBC 3.34 (*)    Hemoglobin 9.0 (*)    HCT 27.0 (*)    RDW 19.0 (*)    Platelets 148 (*)    All other components within normal limits  AMMONIA - Abnormal; Notable for the following:    Ammonia 81 (*)    All other components within normal limits  PROTIME-INR - Abnormal; Notable for the following:    Prothrombin Time 16.4 (*)    All other components within normal limits  URINALYSIS COMPLETEWITH MICROSCOPIC ([facility] ONLY) - Abnormal; Notable for the following:    Color, Urine RED (*)    APPearance TURBID (*)    Hgb urine dipstick 3+ (*)    Protein, ur 100 (*)    Leukocytes, UA 3+ (*)    Bacteria, UA MANY (*)    All other components within normal limits  URINE CULTURE      RADIOLOGY  CT Head Wo Contrast (Final result) Result time: 03/22/16 06:34:51   Final result by Rad Results In Interface (03/22/16 06:34:51)   Narrative:   CLINICAL DATA: Syncope and fall. Increased weakness.  EXAM: CT HEAD WITHOUT CONTRAST  TECHNIQUE: Contiguous axial images were obtained  from the base of the skull through the vertex without intravenous contrast.  COMPARISON: None.  FINDINGS: Ventricles and sulci are symmetrical. No mass effect or midline shift. No abnormal extra-axial fluid collections. Gray-white matter junctions are distinct. Basal cisterns are not effaced. No evidence of acute intracranial hemorrhage. No depressed skull fractures. Visualized paranasal sinuses and mastoid air cells are not opacified. Vascular calcifications.  IMPRESSION: No acute intracranial abnormalities.   Electronically Signed By: Burman NievesWilliam Stevens M.D. On: 03/22/2016 06:34       INITIAL IMPRESSION / ASSESSMENT AND PLAN / ED COURSE  Pertinent labs & imaging results that were available during my care of the patient were reviewed by me and considered in my medical decision making (see chart for details).    ____________________________________________   FINAL CLINICAL IMPRESSION(S) / ED DIAGNOSES  Final diagnoses:  Hepatic encephalopathy (HCC)  Hypokalemia      Darci Currentandolph N Tamyra Fojtik, MD 03/22/16 [id number]  Darci Currentandolph N Aragorn Recker, MD 03/22/16 [phone]

## 2016-03-22 NOTE — Care Management (Addendum)
Called in [facility]an 550 mg [facility] [address] per Dr. Renae GlossWieting. Patient cost after insurance billing is: $0 copay. There is no cost to patient for this drug.

## 2016-03-22 NOTE — ED Notes (Signed)
Pt to rm 8 via EMS from home.  EMS report pt rolled out of bed, husband called EMS because he couldn't get her back in bed.  Per EMS, husband report increased weakness.  PT hx of liver and lung disease, husband states pt is different when labs abnormal.  Pt non-verbal at this time, ems unsure if this is baseline for pt.  PT NAD upon arrival, resp equal and unlabored, skin warm and dry.
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
## 2016-03-23 LAB — COMPREHENSIVE METABOLIC PANEL
ALBUMIN: 2.5 g/dL — AB (ref 3.5–5.0)
ALK PHOS: 152 U/L — AB (ref 38–126)
ALT: 71 U/L — ABNORMAL HIGH (ref 14–54)
ANION GAP: 9 (ref 5–15)
AST: 505 U/L — ABNORMAL HIGH (ref 15–41)
BILIRUBIN TOTAL: 1.8 mg/dL — AB (ref 0.3–1.2)
BUN: 18 mg/dL (ref 6–20)
CALCIUM: 8.9 mg/dL (ref 8.9–10.3)
CO2: 23 mmol/L (ref 22–32)
Chloride: 105 mmol/L (ref 101–111)
Creatinine, Ser: 0.87 mg/dL (ref 0.44–1.00)
Glucose, Bld: 210 mg/dL — ABNORMAL HIGH (ref 65–99)
POTASSIUM: 2.8 mmol/L — AB (ref 3.5–5.1)
Sodium: 137 mmol/L (ref 135–145)
TOTAL PROTEIN: 7.6 g/dL (ref 6.5–8.1)

## 2016-03-23 LAB — GLUCOSE, CAPILLARY
GLUCOSE-CAPILLARY: 205 mg/dL — AB (ref 65–99)
GLUCOSE-CAPILLARY: 190 mg/dL — AB (ref 65–99)
Glucose-Capillary: 232 mg/dL — ABNORMAL HIGH (ref 65–99)
Glucose-Capillary: 202 mg/dL — ABNORMAL HIGH (ref 65–99)

## 2016-03-23 LAB — CBC
HCT: 25.4 % — ABNORMAL LOW (ref 35.0–47.0)
HEMOGLOBIN: 8.5 g/dL — AB (ref 12.0–16.0)
MCH: 27 pg (ref 26.0–34.0)
MCHC: 33.4 g/dL (ref 32.0–36.0)
MCV: 80.7 fL (ref 80.0–100.0)
Platelets: 133 10*3/uL — ABNORMAL LOW (ref 150–440)
RBC: 3.15 MIL/uL — AB (ref 3.80–5.20)
RDW: 18.9 % — AB (ref 11.5–14.5)
WBC: 6.4 10*3/uL (ref 3.6–11.0)

## 2016-03-23 MED ORDER — POTASSIUM CHLORIDE 20 MEQ/15ML (10%) PO SOLN
40.0000 meq | Freq: Once | ORAL | Status: AC
  Administered 2016-03-23: 40 meq via ORAL
  Filled 2016-03-23: qty 30

## 2016-03-23 MED ORDER — INSULIN ASPART 100 UNIT/ML ~~LOC~~ SOLN
0.0000 [IU] | Freq: Three times a day (TID) | SUBCUTANEOUS | Status: DC
  Administered 2016-03-23: 2 [IU] via SUBCUTANEOUS
  Administered 2016-03-23 – 2016-03-24 (×2): 3 [IU] via SUBCUTANEOUS
  Administered 2016-03-24: 5 [IU] via SUBCUTANEOUS
  Administered 2016-03-24: 3 [IU] via SUBCUTANEOUS
  Administered 2016-03-25: 5 [IU] via SUBCUTANEOUS
  Administered 2016-03-25: 3 [IU] via SUBCUTANEOUS
  Administered 2016-03-25: 2 [IU] via SUBCUTANEOUS
  Administered 2016-03-26: 3 [IU] via SUBCUTANEOUS
  Administered 2016-03-26: 2 [IU] via SUBCUTANEOUS
  Administered 2016-03-26 – 2016-03-27 (×2): 3 [IU] via SUBCUTANEOUS
  Administered 2016-03-27: 2 [IU] via SUBCUTANEOUS
  Filled 2016-03-23: qty 3
  Filled 2016-03-23: qty 5
  Filled 2016-03-23 (×2): qty 3
  Filled 2016-03-23 (×2): qty 2
  Filled 2016-03-23 (×3): qty 3
  Filled 2016-03-23 (×3): qty 2
  Filled 2016-03-23: qty 5

## 2016-03-23 MED ORDER — INSULIN GLARGINE 100 UNIT/ML ~~LOC~~ SOLN
10.0000 [IU] | Freq: Every day | SUBCUTANEOUS | Status: DC
  Administered 2016-03-23 – 2016-03-26 (×4): 10 [IU] via SUBCUTANEOUS
  Filled 2016-03-23 (×5): qty 0.1

## 2016-03-23 MED ORDER — POTASSIUM CHLORIDE CRYS ER 20 MEQ PO TBCR
40.0000 meq | EXTENDED_RELEASE_TABLET | Freq: Two times a day (BID) | ORAL | Status: DC
  Administered 2016-03-23 – 2016-03-27 (×9): 40 meq via ORAL
  Filled 2016-03-23 (×9): qty 2

## 2016-03-23 MED ORDER — MAGNESIUM SULFATE 2 GM/50ML IV SOLN
2.0000 g | Freq: Once | INTRAVENOUS | Status: AC
  Administered 2016-03-23: 2 g via INTRAVENOUS
  Filled 2016-03-23: qty 50

## 2016-03-23 MED ORDER — INSULIN ASPART 100 UNIT/ML ~~LOC~~ SOLN
0.0000 [IU] | Freq: Every day | SUBCUTANEOUS | Status: DC
  Administered 2016-03-23 – 2016-03-25 (×3): 2 [IU] via SUBCUTANEOUS
  Administered 2016-03-26: 3 [IU] via SUBCUTANEOUS
  Filled 2016-03-23: qty 3
  Filled 2016-03-23 (×3): qty 2

## 2016-03-23 NOTE — Evaluation (Signed)
Clinical/Bedside Swallow Evaluation Patient Details  Name: Crystal Pratt MRN: [id number] Date of Birth: Aug 28, 1956  Today's Date: 03/23/2016 Time: SLP Start Time (ACUTE ONLY): 1310 SLP Stop Time (ACUTE ONLY): 1353 SLP Time Calculation (min) (ACUTE ONLY): 43 min  Past Medical History:  Past Medical History  Diagnosis Date  . Depression   . Calculus of gallbladder   . Renal disorder   . Diabetes mellitus without complication (HCC)   . Liver disease   . Hypertension   . Anemia   . Back pain   . Retinal detachment   . Hyperlipemia   . Cataract    Past Surgical History:  Past Surgical History  Procedure Laterality Date  . Breast biopsy Right 2007    negative  . Abdominal hysterectomy    . Eye surgery    . Anterior cruciate ligament repair Left   . Kidney stone surgery     HPI:  Pt is a 60 y.o. female with a known history of liver disease, back pain, depression, diabetes mellitus, hypertension, hyperlipidemia was brought by the family member because of confusion since yesterday. Patient has been confused and lethargic since yesterday. She's been disoriented to time place and person. Patient was evaluated in the emergency room her ammonia level is high. She has been treated in the past for hepatic encephalopathy and elevated ammonia. Patient unable to give any history; completely confused and most of the history from the family member at bedside. According to family member patient is compliant with her oral lactulose medication. Currently, pt is drowsy and only alerts briefly to mod-max verbal/tactile stim and repositioning. Pt was nonverbal but appeared to mouth to attempt talking. Noted increased confusion and turning head side to side when given oral stim; min. agitated by the stimulation it appeared. NSG reported pt has swallowed pills w/ small sips of water w/ cues and monitoring.   Assessment / Plan / Recommendation Clinical Impression  Pt appears to present w/ increased risk for  aspiration at this time sec. to her declined Cognitive status suspect related to her increased amonia level. Pt exhibits drowsiness/sleepiness and requires mod-max verbal/tactile cues for follow through w/ tasks. Pt given tsp trials of puree initially to assess readiness for oral intake. Pt exhibited decreased awareness for accepting bolus trials; po task in general. Noted decreased labial closure on utensil to strip the spoon. She required mod-max cues for follow through w/ bolus manipulation and oral clearing. Pt was then given small, single sips of thin liquids which she appeared to manage w/ less cueing though w/ continued decreased alertness to task. Due to pt's presentation of declined alertness and Cognitive confusion (suspect related to her increased amonia level) at this time, pt is at increased risk for aspiration. Recommend pleasure po trials of puree and thin liquids w/ 100% NSG supervision (only at this time) and strict aspiration precautions; meds in Puree as needed - crushed as able. Feeding assistance. When pt can awaken fully and remain awake/alert for po intake/meals, diet will be initiated - suspect tomorrow pt may be more alert. MD/NSG updated and agreed w/ pleasure po's w/ NSG today.     Aspiration Risk  Mild aspiration risk;Moderate aspiration risk    Diet Recommendation  TBD; pleasure bites of po's w/ NSG only at this time until more alert - strict aspiration precautions  Medication Administration: Whole meds with puree (as needed; crushed as needed/able)    Other  Recommendations Recommended Consults:  (Dietician f/u as needed) Oral Care Recommendations:  Oral care QID;Staff/trained caregiver to provide oral care Other Recommendations:  (TBD)   Follow up Recommendations   (TBD)    Frequency and Duration min 3x week  1 week       Prognosis Prognosis for Safe Diet Advancement: Good      Swallow Study   General Date of Onset: 03/22/16 HPI: Pt is a 60 y.o. female with a  known history of liver disease, back pain, depression, diabetes mellitus, hypertension, hyperlipidemia was brought by the family member because of confusion since yesterday. Patient has been confused and lethargic since yesterday. She's been disoriented to time place and person. Patient was evaluated in the emergency room her ammonia level is high. She has been treated in the past for hepatic encephalopathy and elevated ammonia. Patient unable to give any history; completely confused and most of the history from the family member at bedside. According to family member patient is compliant with her oral lactulose medication. Currently, pt is drowsy and only alerts briefly to mod-max verbal/tactile stim and repositioning. Pt was nonverbal but appeared to mouth to attempt talking. Noted increased confusion and turning head side to side when given oral stim; min. agitated by the stimulation it appeared. NSG reported pt has swallowed pills w/ small sips of water w/ cues and monitoring. Type of Study: Bedside Swallow Evaluation Previous Swallow Assessment: none indicated Diet Prior to this Study: NPO (currently NPO; suspect regular diet at home) Temperature Spikes Noted:  (temp 99.6; wbc 6.4) Respiratory Status: Room air History of Recent Intubation: No Behavior/Cognition: Lethargic/Drowsy;Confused;Doesn't follow directions;Requires cueing Oral Cavity Assessment:  (limited evaluation d/t declined Cognition and follow through) Oral Care Completed by SLP: Yes Oral Cavity - Dentition: Missing dentition (some natural dentition) Vision:  (n/a) Self-Feeding Abilities: Total assist Patient Positioning: Upright in bed Baseline Vocal Quality:  (nonverbal) Volitional Cough: Cognitively unable to elicit Volitional Swallow: Unable to elicit    Oral/Motor/Sensory Function Overall Oral Motor/Sensory Function:  (unable to assess sec. to declined Cognition)   Ice Chips Ice chips: Not tested Other Comments: decreased  oral awareness   Thin Liquid Thin Liquid: Impaired Presentation: Straw (fed; 4 swallows/sips w/ straw pinched to monitor) Oral Phase Impairments: Poor awareness of bolus Pharyngeal  Phase Impairments:  (none) Other Comments: decreased awarness for accepting bolus trials; po task. Required mod-max cues for follow through    Nectar Thick Nectar Thick Liquid: Not tested   Honey Thick Honey Thick Liquid: Not tested   Puree Puree: Impaired Presentation: Spoon (fed; 5 trials) Oral Phase Impairments: Poor awareness of bolus Oral Phase Functional Implications: Oral holding;Prolonged oral transit (suspect related to Cognition) Pharyngeal Phase Impairments:  (none) Other Comments: decreased awareness for accepting bolus trials; po task in general. Noted decreased labial closure on utensil to strip the spoon. Required mod-max cues for follow through w/ task and oral clearing.    Solid   GO   Solid: Not tested        Jerilynn SomKatherine Watson, MS, CCC-SLP  Watson,Katherine 03/23/2016,1:54 PM

## 2016-03-23 NOTE — Progress Notes (Signed)
Patient ID: Crystal Pratt, female   DOB: 01/28/1956, 61 y.o.   MRN: [id number] [facility] PROGRESS NOTE  Crystal Pratt:[id number] DOB: 1956-08-17 DOA: 03/22/2016 PCP: Donnamae Jude, MD  HPI/Subjective: Patient is awake but unable to talk to me at this point. She was able to follow commands.  Objective: Filed Vitals:   03/23/16 0319 03/23/16 1131  BP: 136/44 129/44  Pulse: 93 93  Temp: 98 F (36.7 C) 97.2 F (36.2 C)  Resp: 18 19    Filed Weights   03/22/16 0333 03/22/16 0925 03/23/16 1131  Weight: 95.255 kg (210 lb) 75.705 kg (166 lb 14.4 oz) 76.794 kg (169 lb 4.8 oz)    ROS: Review of Systems  Unable to perform ROS Unable to provide review of systems secondary to altered mental status Exam: Physical Exam  Constitutional: She appears lethargic.  HENT:  Nose: No mucosal edema.  Mouth/Throat: No oropharyngeal exudate or posterior oropharyngeal edema.  Eyes: Conjunctivae and lids are normal. Pupils are equal, round, and reactive to light.  Neck: No JVD present. Carotid bruit is not present. No edema present. No thyroid mass and no thyromegaly present.  Cardiovascular: S1 normal and S2 normal.  Exam reveals no gallop.   Murmur heard.  Systolic murmur is present with a grade of 2/6  Pulses:      Dorsalis pedis pulses are 2+ on the right side, and 2+ on the left side.  Respiratory: No respiratory distress. She has no wheezes. She has no rhonchi. She has no rales.  GI: Soft. Bowel sounds are normal. She exhibits distension. There is no tenderness.  Musculoskeletal:       Right ankle: She exhibits swelling.       Left ankle: She exhibits swelling.  Lymphadenopathy:    She has no cervical adenopathy.  Neurological: She appears lethargic.  Patient moves her extremities on her own. Unable to follow commands at this time.  Skin: Skin is warm. Nails show no clubbing.  Spider angiomas over neck and upper chest and face.  Psychiatric:  Unable to assess secondary  to altered mental status      Data Reviewed: Basic Metabolic Panel:  Recent Labs Lab 03/22/16 0352 03/22/16 0952 03/22/16 1609 03/23/16 0436  NA 139  --   --  137  K 2.6*  --  3.1* 2.8*  CL 102  --   --  105  CO2 23  --   --  23  GLUCOSE 166*  --   --  210*  BUN 23*  --   --  18  CREATININE 1.11* 1.04*  --  0.87  CALCIUM 9.7  --   --  8.9  MG 1.8  --   --   --    Liver Function Tests:  Recent Labs Lab 03/22/16 0352 03/23/16 0436  AST 300* 505*  ALT 47 71*  ALKPHOS 160* 152*  BILITOT 2.0* 1.8*  PROT 8.6* 7.6  ALBUMIN 2.6* 2.5*    Recent Labs Lab 03/22/16 0352  AMMONIA 81*   CBC:  Recent Labs Lab 03/22/16 0352 03/22/16 0952 03/23/16 0436  WBC 8.8 8.5 6.4  HGB 9.0* 8.8* 8.5*  HCT 27.0* 25.8* 25.4*  MCV 80.8 80.2 80.7  PLT 148* 138* 133*     CBG:  Recent Labs Lab 03/22/16 0943 03/22/16 1727 03/22/16 2105 03/23/16 0724 03/23/16 1126  GLUCAP 145* 227* 232* 205* 232*    Studies: Ct Head Wo Contrast  03/22/2016  CLINICAL DATA:  Syncope  and fall.  Increased weakness. EXAM: CT HEAD WITHOUT CONTRAST TECHNIQUE: Contiguous axial images were obtained from the base of the skull through the vertex without intravenous contrast. COMPARISON:  None. FINDINGS: Ventricles and sulci are symmetrical. No mass effect or midline shift. No abnormal extra-axial fluid collections. Gray-white matter junctions are distinct. Basal cisterns are not effaced. No evidence of acute intracranial hemorrhage. No depressed skull fractures. Visualized paranasal sinuses and mastoid air cells are not opacified. Vascular calcifications. IMPRESSION: No acute intracranial abnormalities. Electronically Signed   By: Burman NievesWilliam  Stevens M.D.   On: 03/22/2016 06:34    Scheduled Meds: . antiseptic oral rinse  7 mL Mouth Rinse BID  . cefTRIAXone (ROCEPHIN)  IV  1 g Intravenous Q24H  . enoxaparin (LOVENOX) injection  40 mg Subcutaneous Q24H  . fluticasone  2 spray Each Nare Daily  . insulin  aspart  0-5 Units Subcutaneous QHS  . insulin aspart  0-9 Units Subcutaneous TID WC  . insulin glargine  10 Units Subcutaneous QHS  . lactulose  30 g Oral TID  . magnesium sulfate 1 - 4 g bolus IVPB  2 g Intravenous Once  . multivitamin with minerals  1 tablet Oral Daily  . potassium chloride  40 mEq Oral BID  . rifaximin  550 mg Oral BID  . sertraline  100 mg Oral Daily  . sodium chloride flush  3 mL Intravenous Q12H  . tiotropium  18 mcg Inhalation Daily   Continuous Infusions: . 0.9 % NaCl with KCl 20 mEq / L 75 mL/hr at 03/23/16 0318    Assessment/Plan:  1. Acute hepatic encephalopathy. Continue Lactulose 3 times a day.  Continue Xifaxan 550 mg twice a day. As per nursing staff patient had quite a few bowel movements overnight. 2. Cirrhosis of the liver. [facility] liver is unspecified what type. 3. Type 2 diabetes mellitus. Started sliding scale and low dose Lantus 4. Severe hypokalemia potassium replacement in IV fluids and orally. 5. Increased liver function tests likely secondary to cirrhosis. Check a Tylenol level tomorrow 6. Acute cystitis with hematuria on Rocephin follow-up urine culture 7. Hypomagnesemia replaced magnesium IV 8. History of depression continue Zoloft 9. Dehydration. Continue IV fluids and hold water pills at this point.  Code Status:     Code Status Orders        Start     Ordered   03/22/16 0924  Full code   Continuous     03/22/16 0924    Code Status History    Date Active Date Inactive Code Status Order ID Comments User Context   This patient has a current code status but no historical code status.     Family Communication: Family at the bedside Disposition Plan: To be determined  Antibiotics:  rocephin  Time spent: 25 minutes  Alford HighlandWIETING, Stephanny Tsutsui  [employer]

## 2016-03-23 NOTE — Progress Notes (Signed)
Patient has rested quietly today, sleeping between care. Daughter-in-law at bedside for most of the day. Patient remains confused and lethargic, mainly just answering yes or no questions. Seen by SLP today who recommends occassional 'pleasure bites' of applesauce/ice cream and sips of thin liquids only with nursing supervision for now. Sleeping at this time. Will continue to monitor.

## 2016-03-23 NOTE — Progress Notes (Signed)
Nurse was called about a critical potassium 2.8.  Informed Dr. Sheryle Hailiamond that patient is currently receiving lactulose due to elevated ammonia level.  Patient is being administered 20 meq of potassium at 3575ml/hr. New order: liquid potassium.

## 2016-03-23 NOTE — Progress Notes (Signed)
Inpatient Diabetes Program Recommendations  AACE/ADA: New Consensus Statement on Inpatient Glycemic Control (2015)  Target Ranges:  Prepandial:   less than 140 mg/dL      Peak postprandial:   less than 180 mg/dL (1-2 hours)      Critically ill patients:  140 - 180 mg/dL   Review of Glycemic Control  Results for Crystal Pratt, Shakenna M (MRN [id number]) as of 03/23/2016 11:03  Ref. Range 03/22/2016 09:43 03/22/2016 17:27 03/22/2016 21:05 03/23/2016 07:24  Glucose-Capillary Latest Ref Range: 65-99 mg/dL 086145 (H) 578227 (H) 469232 (H) 205 (H)    Diabetes history: Type 2 Outpatient Diabetes medications: Degludec U200 80 units qhs, Victoza 1.8mg mg/day, Metformin 500mg  bid Current orders for Inpatient glycemic control: none  Inpatient Diabetes Program Recommendations:  Consider starting the patient on Novolog sensitive correction 0-9 units tid and Novolog 0-5 units qhs and Lantus 10 units qhs.   Susette RacerJulie Merie Wulf, RN, BA, MHA, CDE Diabetes Coordinator Inpatient Diabetes Program  [phone] (Team Pager) [phone] [facility] Office) 03/23/2016 11:25 AM

## 2016-03-24 LAB — BASIC METABOLIC PANEL
ANION GAP: 7 (ref 5–15)
BUN: 18 mg/dL (ref 6–20)
CHLORIDE: 114 mmol/L — AB (ref 101–111)
CO2: 22 mmol/L (ref 22–32)
Calcium: 8.9 mg/dL (ref 8.9–10.3)
Creatinine, Ser: 0.61 mg/dL (ref 0.44–1.00)
GFR calc Af Amer: >60 mL/min (ref >60)
GFR calc non Af Amer: >60 mL/min (ref >60)
GLUCOSE: 173 mg/dL — AB (ref 65–99)
POTASSIUM: 3.5 mmol/L (ref 3.5–5.1)
Sodium: 143 mmol/L (ref 135–145)

## 2016-03-24 LAB — GLUCOSE, CAPILLARY
GLUCOSE-CAPILLARY: 151 mg/dL — AB (ref 65–99)
GLUCOSE-CAPILLARY: 299 mg/dL — AB (ref 65–99)
GLUCOSE-CAPILLARY: 220 mg/dL — AB (ref 65–99)
Glucose-Capillary: 226 mg/dL — ABNORMAL HIGH (ref 65–99)

## 2016-03-24 LAB — MAGNESIUM: MAGNESIUM: 2.2 mg/dL (ref 1.7–2.4)

## 2016-03-24 LAB — ACETAMINOPHEN LEVEL: Acetaminophen (Tylenol), Serum: <10 ug/mL — ABNORMAL LOW (ref 10–30)

## 2016-03-24 LAB — AMMONIA: Ammonia: 45 umol/L — ABNORMAL HIGH (ref 9–35)

## 2016-03-24 NOTE — Clinical Social Work Note (Signed)
Clinical Social Work Assessment  Patient Details  Name: Crystal Pratt MRN: [id number] Date of Birth: [birth year]  Date of referral:  03/24/16               Reason for consult:  Facility Placement                Permission sought to share information with:  Facility Medical sales representativeContact Representative, Family Supports Permission granted to share information::  Yes, Verbal Permission Granted  Name::        Agency::     Relationship::     Contact Information:     Housing/Transportation Living arrangements for the past 2 months:  Skilled Building surveyorursing Facility Source of Information:  Patient Patient Interpreter Needed:  None Criminal Activity/Legal Involvement Pertinent to Current Situation/Hospitalization:  No - Comment as needed Significant Relationships:  Spouse Lives with:  Spouse Do you feel safe going back to the place where you live?    Need for family participation in patient care:     Care giving concerns:  Patient resides with her spouse.   Social Worker assessment / plan:  PT informed CSW that they are recommending rehab for patient. CSW attempted to see patient this afternoon to discuss this and patient states she is agreeable for rehab but when asked other questions she began to fall asleep. She gave me permission to call her husband. I attempted to call her husband but had to leave a message. Bedsearch initiated in case patient and husband remain agreeable. [employer] will have to authorize STR and CSW will need to check STR benefits on Monday.   Employment status:    Insurance information:  Managed Care PT Recommendations:  Skilled Nursing Facility Information / Referral to community resources:  Skilled Nursing Facility  Patient/Family's Response to care:  Patient was appreciative of CSW visit.   Patient/Family's Understanding of and Emotional Response to Diagnosis, Current Treatment, and Prognosis:  Patient not demonstrating a real understanding of what CSW was attempting to discuss with her.    Emotional Assessment Appearance:  Appears older than stated age, Disheveled Attitude/Demeanor/Rapport:   (lethargic but communicative) Affect (typically observed):   (lethargic) Orientation:  Oriented to Self, Oriented to Place Alcohol / Substance use:  Not Applicable Psych involvement (Current and /or in the community):  No (Comment)  Discharge Needs  Concerns to be addressed:  Denies Needs/Concerns at this time Readmission within the last 30 days:  No Current discharge risk:  None Barriers to Discharge:  No Barriers Identified   York SpanielMonica Conor Lata, LCSW 03/24/2016, 4:39 PM

## 2016-03-24 NOTE — Evaluation (Signed)
Physical Therapy Evaluation Patient Details Name: Crystal Pratt MRN: [id number] DOB: [birth year] Today's Date: 03/24/2016   History of Present Illness  60 yo F presented to ED on 5/3 after husband found her having rolled OOB on the floor and couldn't get her up. She was also presenting with increased confusion. Per husband she has had decreased functional mobility the past few weeks with medical complications. PMH includes liver disease and DM.  Clinical Impression  Pt demonstrated generalized weakness and difficulty walking after medical complications and decreased functional mobility the past few weeks. She is lethargic and has difficulty following simple commands, and requires repeated instructions to perform. Pt required min guard for bed mobility. Transfers and limited ambulation up to 15 ft with SPC required min A. Demonstrated unsteady, slow gait and required heavy verbal and tactile cues for navigation and safety. STR is recommended after hospital discharge to address deficits of strength, balance, gait and activity tolerance to progress towards PLOF. Pt will benefit from skilled PT services to increase functional I and mobility for safe discharge.     Follow Up Recommendations SNF    Equipment Recommendations  Rolling walker with 5" wheels    Recommendations for Other Services       Precautions / Restrictions Precautions Precautions: Fall Restrictions Weight Bearing Restrictions: No      Mobility  Bed Mobility Overal bed mobility: Needs Assistance Bed Mobility: Supine to Sit;Sit to Supine     Supine to sit: Min guard Sit to supine: Min guard   General bed mobility comments: uses rail  Transfers Overall transfer level: Needs assistance Equipment used: Straight cane Transfers: Sit to/from [employer] Sit to Stand: Min assist Stand pivot transfers: Min assist       General transfer comment: Heavy cues for hand placement, navigation and staying on  task  Ambulation/Gait Ambulation/Gait assistance: Min assist Ambulation Distance (Feet): 15 Feet Assistive device: Straight cane Gait Pattern/deviations: Decreased stride length;Shuffle;Drifts right/left;Narrow base of support     General Gait Details: Demonstrated difficulty staying on task. Unsteady, slow gait. Heavy cues for navigation and safety.  Stairs            Wheelchair Mobility    Modified Rankin (Stroke Patients Only)       Balance Overall balance assessment: Needs assistance Sitting-balance support: Bilateral upper extremity supported;Feet supported Sitting balance-Leahy Scale: Good     Standing balance support: Single extremity supported Standing balance-Leahy Scale: Poor Standing balance comment: requires min A to maintain balance                             Pertinent Vitals/Pain Pain Assessment: No/denies pain    Home Living Family/patient expects to be discharged to:: Private residence Living Arrangements: Spouse/significant other Available Help at Discharge: Family (husband works during the day) Type of Home: House Home Access: Stairs to enter Entrance Stairs-Rails: Can reach both Secretary/administrator of Steps: 2-5 Home Layout: One level Home Equipment: Cane - single point      Prior Function Level of Independence: Needs assistance   Gait / Transfers Assistance Needed: Previously pt was independent and ambulatory without AD. She has most recently been using a cane the past few weeks.l  ADL's / Homemaking Assistance Needed: assitance from husband        Hand Dominance        Extremity/Trunk Assessment   Upper Extremity Assessment: Generalized weakness  Lower Extremity Assessment: Generalized weakness         Communication      Cognition Arousal/Alertness: Lethargic Behavior During Therapy: Flat affect Overall Cognitive Status: Impaired/Different from baseline Area of Impairment:  Orientation;Attention;Memory;Following commands;Safety/judgement;Awareness Orientation Level: Place;Time;Situation Current Attention Level: Alternating   Following Commands: Follows one step commands inconsistently       General Comments: Pt's confusion has improved per husband but she is not back to her baseline.    General Comments      Exercises Other Exercises Other Exercises: B LE therex: supine: ankle pumps, heel slides, hip abd slides; seated LAQs x10 each. Cues for staying on task and technique of exercise. Required AAROM, mainly due to inattention to task. Other Exercises: Pt ambulated 15 ft to and from toilet with SPC and min A. Heavy verbal and tactile cues for navigation and safety.      Assessment/Plan    PT Assessment Patient needs continued PT services  PT Diagnosis Difficulty walking;Generalized weakness   PT Problem List Decreased strength;Decreased activity tolerance;Decreased balance;Decreased mobility;Decreased cognition;Decreased knowledge of use of DME;Decreased safety awareness  PT Treatment Interventions DME instruction;Gait training;Stair training;Therapeutic activities;Therapeutic exercise;Balance training;Neuromuscular re-education;Patient/family education   PT Goals (Current goals can be found in the Care Plan section) Acute Rehab PT Goals PT Goal Formulation: With family Time For Goal Achievement: 04/07/16 Potential to Achieve Goals: Fair    Frequency Min 2X/week   Barriers to discharge Inaccessible home environment;Decreased caregiver support stairs to enter, needs 24 hr care    Co-evaluation               End of Session Equipment Utilized During Treatment: Gait belt Activity Tolerance: Patient limited by lethargy Patient left: in bed;with call bell/phone within reach;with bed alarm set;with family/visitor present Nurse Communication: Mobility status         Time: [id number] PT Time Calculation (min) (ACUTE ONLY): 27  min   Charges:   PT Evaluation $PT Eval High Complexity: 1 Procedure PT Treatments $Therapeutic Exercise: 8-22 mins   PT G Codes:        Adelene IdlerMindy Jo Sherif Millspaugh, PT, DPT  03/24/2016, 3:23 PM [phone]

## 2016-03-24 NOTE — Care Management Note (Signed)
Case Management Note  Patient Details  Name: Crystal Pratt MRN: [id number] Date of Birth: 1956-06-01  Subjective/Objective:   CM consult for home health needs.  Admitted with acute hepatic encephalopathy, Cirrhosis. Presents from home where she lives with her spouse. She stays by herself during the day and her husband comes home from work every few hours and checks on her. Prior to admission, patient was using a cane and spouse assisted with adls. She is intermittently lethargic and confused now but per MD notes making progress with increased LOC. Met with spouse at bedside. Updated him on PT consult. He is agreeable to STR with no facility preference. He is also agreeable to home health SN, PT and SW with no agency preference. Will follow up following PT eval for discharge planning needs.                  Action/Plan:   Expected Discharge Date:                  Expected Discharge Plan:     In-House Referral:     Discharge planning Services  CM Consult  Post Acute Care Choice:    Choice offered to:  Spouse  DME Arranged:    DME Agency:     HH Arranged:    [place] Agency:     Status of Service:  In process, will continue to follow  Medicare Important Message Given:    Date Medicare IM Given:    Medicare IM give by:    Date Additional Medicare IM Given:    Additional Medicare Important Message give by:     If discussed at [place] of Stay Meetings, dates discussed:    Additional Comments:  Jolly Mango, RN 03/24/2016, 10:55 AM

## 2016-03-24 NOTE — Progress Notes (Signed)
No tele. Room air. FS are stable. Pt reports no pain. Incontinent. Takes meds crushed in apple sauce. Family at the bedside and updated by MD Allena KatzPatel.IV abs. Pt has no further concerns at this time.

## 2016-03-24 NOTE — Progress Notes (Signed)
Speech Language Pathology Treatment: Dysphagia  Patient Details Name: Crystal Pratt MRN: [id number] DOB: 1956-01-16 Today's Date: 03/24/2016 Time: [id number] SLP Time Calculation (min) (ACUTE ONLY): 45 min  Assessment / Plan / Recommendation Clinical Impression  Pt's mentation and attention to oral intake tasks/feeding self has significantly improved from yesterday. Pt was able to tolerate trials of thin liquids via cup/straw and trials of puree w/ no overt s/s of aspiration noted; min. Slower bolus management for transfer/swallow w/ increased textures(puree) but pt orally cleared appropriately w/ all trials. Pt helped to feed self by holding the cup to drink. Reduced distractions in room during po trials to allow pt to focus on po tasks. NSG gave meds in puree - crushed during session which pt appeared to tolerate.  Pt appears at reduced-minimal risk for aspiration w/ oral intake at this time as her overall mentation is improving. Pt appears able to tolerate a Dys. 1 w/ thin liquids diet w/ general aspiration precautions; support w/ feeding during meals; meds in Puree - crushed. Pt would benefit from feeding assistance and monitoring during meals to lessen risk for aspiration. NSG updated and agreed. Pt nodded in agreement. ST services will continue to f/u w/ pt's progress and appropriateness to upgrade diet consistency(foods) when indicated as Cognitive status continues to improve.   HPI HPI: Pt is a 60 y.o. female with a known history of liver disease, back pain, depression, diabetes mellitus, hypertension, hyperlipidemia was brought by the family member because of confusion since yesterday. Patient has been confused and lethargic since yesterday. She's been disoriented to time place and person. Patient was evaluated in the emergency room her ammonia level is high. She has been treated in the past for hepatic encephalopathy and elevated ammonia. Patient unable to give any history; completely confused  and most of the history from the family member at bedside. According to family member patient is compliant with her oral lactulose medication. Currently, pt is min. drowsy but more alert requring only min. verbal cues and support today. Per NSG, pt has swallowed pills w/ small sips of water w/ cues and monitoring. Pt's mentation has improved from BSE; slow to respond but few verbal responses today and pt able to assist in feeding self w/ SLP - follow directions.       SLP Plan  Continue with current plan of care     Recommendations  Diet recommendations: Dysphagia 1 (puree);Thin liquid Liquids provided via: Cup;Straw Medication Administration: Crushed with puree (or in liquid form) Supervision: Staff to assist with self feeding;Full supervision/cueing for compensatory strategies Compensations: Minimize environmental distractions;Slow rate;Small sips/bites;Follow solids with liquid (time b/t trials; rest breaks as needed) Postural Changes and/or Swallow Maneuvers: Seated upright 90 degrees             General recommendations:  (Dietician f/u if needed) Oral Care Recommendations: Oral care BID;Staff/trained caregiver to provide oral care Follow up Recommendations: None Plan: Continue with current plan of care     GO               Jerilynn SomKatherine Bernetha Anschutz, MS, CCC-SLP  Keon Benscoter 03/24/2016, 10:47 AM

## 2016-03-24 NOTE — Plan of Care (Signed)
Problem: SLP Dysphagia Goals Goal: Misc Dysphagia Goal Pt will safely tolerate po diet of least restrictive consistency w/ no overt s/s of aspiration noted by Staff/pt/family x3 sessions.    

## 2016-03-24 NOTE — Clinical Social Work Placement (Signed)
   CLINICAL SOCIAL WORK PLACEMENT  NOTE  Date:  03/24/2016  Patient Details  Name: Crystal Pratt MRN: [id number] Date of Birth: 06-Apr-1956  Clinical Social Work is seeking post-discharge placement for this patient at the Skilled  Nursing Facility level of care (*CSW will initial, date and re-position this form in  chart as items are completed):  Yes   Patient/family provided with [place] Clinical Social Work Department's list of facilities offering this level of care within the geographic area requested by the patient (or if unable, by the patient's family).  Yes   Patient/family informed of their freedom to choose among providers that offer the needed level of care, that participate in Medicare, Medicaid or managed care program needed by the patient, have an available bed and are willing to accept the patient.  Yes   Patient/family informed of [facility]'s ownership interest [facility] and [facility], as well as of the fact that they are under no obligation to receive care at these facilities.  PASRR submitted to EDS on 03/24/16     PASRR number received on 03/24/16     Existing PASRR number confirmed on       FL2 transmitted to all facilities in geographic area requested by pt/family on 03/24/16     FL2 transmitted to all facilities within larger geographic area on       Patient informed that his/her managed care company has contracts with or will negotiate with certain facilities, including the following:            Patient/family informed of bed offers received.  Patient chooses bed at       Physician recommends and patient chooses bed at      Patient to be transferred to   on  .  Patient to be transferred to facility by       Patient family notified on   of transfer.  Name of family member notified:        PHYSICIAN       Additional Comment:    _______________________________________________ Haig ProphetMorgan, Veronika Heard G, LCSW 03/24/2016, 4:40 PM

## 2016-03-24 NOTE — Progress Notes (Addendum)
Patient ID: Crystal Pratt, female   DOB: 07/09/1956, 60 y.o.   MRN: [id number] [employer] PROGRESS NOTE  Crystal Pratt WUJ:[id number] DOB: October 28, 1956 DOA: 03/22/2016 PCP: Crystal Jude, MD  HPI/Subjective: Mental status much improvedbut still not back to baseline  Objective: Filed Vitals:   03/24/16 0553 03/24/16 1207  BP: 108/46 114/43  Pulse: 88 91  Temp: 98 F (36.7 C) 98.2 F (36.8 C)  Resp: 20 18    Filed Weights   03/22/16 0925 03/23/16 1131 03/24/16 0553  Weight: 75.705 kg (166 lb 14.4 oz) 76.794 kg (169 lb 4.8 oz) 76.749 kg (169 lb 3.2 oz)    ROS: Review of Systems  Unable to perform ROS Unable to provide review of systems secondary to altered mental status Exam: Physical Exam  Constitutional: She appears chronically ill HENT:  Nose: No mucosal edema.  Mouth/Throat: No oropharyngeal exudate or posterior oropharyngeal edema.  Eyes: Conjunctivae and lids are normal. Pupils are equal, round, and reactive to light.  Neck: No JVD present. Carotid bruit is not present. No edema present. No thyroid mass and no thyromegaly present.  Cardiovascular: S1 normal and S2 normal.  Exam reveals no gallop.    Systolic murmur is present with a grade of 2/6  Pulses:      Dorsalis pedis pulses are 2+ on the right side, and 2+ on the left side.  Respiratory: No respiratory distress. She has no wheezes. She has no rhonchi. She has no rales.  GI: Soft. Bowel sounds are normal. She exhibits distension. There is no tenderness.  Musculoskeletal:       Right ankle: She exhibits swelling.       Left ankle: She exhibits swelling.  Lymphadenopathy:    She has no cervical adenopathy.  Neurological: She appearsawake  No focal deficits cranial nerves II-12intact Skin: Skin is warm. Nails show no clubbing.  Spider angiomas over neck and upper chest and face.  Psychiatric:  Not anxious or depressed     Data Reviewed: Basic Metabolic Panel:  Recent Labs Lab 03/22/16 0352  03/22/16 0952 03/22/16 1609 03/23/16 0436 03/24/16 0719  NA 139  --   --  137 143  K 2.6*  --  3.1* 2.8* 3.5  CL 102  --   --  105 114*  CO2 23  --   --  23 22  GLUCOSE 166*  --   --  210* 173*  BUN 23*  --   --  18 18  CREATININE 1.11* 1.04*  --  0.87 0.61  CALCIUM 9.7  --   --  8.9 8.9  MG 1.8  --   --   --  2.2   Liver Function Tests:  Recent Labs Lab 03/22/16 0352 03/23/16 0436  AST 300* 505*  ALT 47 71*  ALKPHOS 160* 152*  BILITOT 2.0* 1.8*  PROT 8.6* 7.6  ALBUMIN 2.6* 2.5*    Recent Labs Lab 03/22/16 0352 03/24/16 0719  AMMONIA 81* 45*   CBC:  Recent Labs Lab 03/22/16 0352 03/22/16 0952 03/23/16 0436  WBC 8.8 8.5 6.4  HGB 9.0* 8.8* 8.5*  HCT 27.0* 25.8* 25.4*  MCV 80.8 80.2 80.7  PLT 148* 138* 133*     CBG:  Recent Labs Lab 03/23/16 1126 03/23/16 1634 03/23/16 2054 03/24/16 0729 03/24/16 1206  GLUCAP 232* 190* 202* 151* 299*    Studies: No results found.  Scheduled Meds: . antiseptic oral rinse  7 mL Mouth Rinse BID  . cefTRIAXone (ROCEPHIN)  IV  1 g Intravenous Q24H  . enoxaparin (LOVENOX) injection  40 mg Subcutaneous Q24H  . fluticasone  2 spray Each Nare Daily  . insulin aspart  0-5 Units Subcutaneous QHS  . insulin aspart  0-9 Units Subcutaneous TID WC  . insulin glargine  10 Units Subcutaneous QHS  . lactulose  30 g Oral TID  . magnesium sulfate 1 - 4 g bolus IVPB  2 g Intravenous Once  . multivitamin with minerals  1 tablet Oral Daily  . potassium chloride  40 mEq Oral BID  . rifaximin  550 mg Oral BID  . sertraline  100 mg Oral Daily  . sodium chloride flush  3 mL Intravenous Q12H  . tiotropium  18 mcg Inhalation Daily   Continuous Infusions: . 0.9 % NaCl with KCl 20 mEq / L 75 mL/hr at 03/23/16 0318    Assessment/Plan:  1. Acute hepatic encephalopathy. patient continues to improve continue lactulose and Xifaxan 2. Cirrhosis of the liver.[facility] as [facility] 3. Type 2 diabetes mellitus.blood sugar  labile continue Lantus and  NovoLog 4. Severe hypokalemia potassium replaced 5. Increased liver function tests likely secondary to cirrhosis.  6. Acute cystitis with hematuria on Rocephin urine culture showing gram-negative rods 7. Hypomagnesemia replaced magnesium IV 8. History of depression continue Zoloft 9. Dehydration. Continue IV fluids and hold water pills at this point.  Code Status:     Code Status Orders        Start     Ordered   03/22/16 0924  Full code   Continuous     03/22/16 0924    Code Status History    Date Active Date Inactive Code Status Order ID Comments User Context   This patient has a current code status but no historical code status.     Family Communication: Family at the bedside Disposition Plan: To be determined  Antibiotics:  rocephin  Time spent: 25 minutes  Lucretia Pendley, [facility]  [employer]

## 2016-03-24 NOTE — NC FL2 (Signed)
[place] MEDICAID FL2 LEVEL OF CARE SCREENING TOOL     IDENTIFICATION  Patient Name: Crystal Pratt Birthdate: 08-28-1956 Sex: female Admission Date (Current Location): 03/22/2016  [place]   Facility and Address:  [facility], [address], [place], [place] [address]      Provider Number: [id number]  Attending Physician Name and Address:  Auburn BilberryShreyang Patel, MD  Relative Name and Phone Number:       Current Level of Care: Hospital Recommended Level of Care: Skilled Nursing Facility Prior Approval Number:    Date Approved/Denied:   PASRR Number:  [id number])  Discharge Plan: SNF    Current Diagnoses: Patient Active Problem List   Diagnosis Date Noted  . Hepatic encephalopathy (HCC) 03/22/2016  . Hypokalemia 03/22/2016    Orientation RESPIRATION BLADDER Height & Weight     Self, Time, Place  Normal Incontinent Weight: 169 lb 3.2 oz (76.749 kg) Height:  5\' 3"  (160 cm)  BEHAVIORAL SYMPTOMS/MOOD NEUROLOGICAL BOWEL NUTRITION STATUS   (none )  (none ) Incontinent Diet (Diet: DYS 1 )  AMBULATORY STATUS COMMUNICATION OF NEEDS Skin   Extensive Assist Verbally Normal                       Personal Care Assistance Level of Assistance  Bathing, Feeding, Dressing Bathing Assistance: Limited assistance Feeding assistance: Independent Dressing Assistance: Limited assistance     Functional Limitations Info  Sight, Hearing, Speech Sight Info: Adequate Hearing Info: Adequate Speech Info: Adequate    SPECIAL CARE FACTORS FREQUENCY  PT (By licensed PT), OT (By licensed OT)     PT Frequency:  (5) OT Frequency:  (5)            Contractures      Additional Factors Info  Code Status, Allergies, Psychotropic, Insulin Sliding Scale Code Status Info:  (Full Code. ) Allergies Info:  (Sulfa Antibiotics )   Insulin Sliding Scale Info:  (NovoLog Insulin Injections 3 times daily )       Current Medications  (03/24/2016):  This is the current hospital active medication list Current Facility-Administered Medications  Medication Dose Route Frequency Provider Last Rate Last Dose  . 0.9 % NaCl with KCl 20 mEq/ L  infusion   Intravenous Continuous Ihor AustinPavan Pyreddy, MD 75 mL/hr at 03/24/16 1215    . albuterol (PROVENTIL) (2.5 MG/3ML) 0.083% nebulizer solution 2.5 mg  2.5 mg Nebulization Q6H PRN Ihor AustinPavan Pyreddy, MD      . antiseptic oral rinse (CPC / CETYLPYRIDINIUM CHLORIDE 0.05%) solution 7 mL  7 mL Mouth Rinse BID Alford Highlandichard Wieting, MD   7 mL at 03/24/16 1000  . cefTRIAXone (ROCEPHIN) 1 g in dextrose 5 % 50 mL IVPB  1 g Intravenous Q24H Ihor AustinPavan Pyreddy, MD 100 mL/hr at 03/24/16 0548 1 g at 03/24/16 0548  . enoxaparin (LOVENOX) injection 40 mg  40 mg Subcutaneous Q24H Ihor AustinPavan Pyreddy, MD   40 mg at 03/24/16 0834  . fluticasone (FLONASE) 50 MCG/ACT nasal spray 2 spray  2 spray Each Nare Daily Ihor AustinPavan Pyreddy, MD   2 spray at 03/24/16 1000  . insulin aspart (novoLOG) injection 0-5 Units  0-5 Units Subcutaneous QHS Alford Highlandichard Wieting, MD   2 Units at 03/23/16 2137  . insulin aspart (novoLOG) injection 0-9 Units  0-9 Units Subcutaneous TID WC Alford Highlandichard Wieting, MD   5 Units at 03/24/16 1200  . insulin glargine (LANTUS) injection 10 Units  10 Units Subcutaneous QHS Richard The ServiceMaster CompanyWieting,  MD   10 Units at 03/23/16 2136  . ipratropium-albuterol (DUONEB) 0.5-2.5 (3) MG/3ML nebulizer solution 3 mL  3 mL Nebulization Q4H PRN Pavan Pyreddy, MD      . lactulose (CHRONULAC) 10 GM/15ML solution 30 g  30 g Oral TID Ihor Austin, MD   30 g at 03/24/16 0834  . magnesium sulfate IVPB 2 g 50 mL  2 g Intravenous Once Alford Highland, MD   2 g at 03/22/16 0820  . multivitamin with minerals tablet 1 tablet  1 tablet Oral Daily Ihor Austin, MD   1 tablet at 03/24/16 0835  . ondansetron (ZOFRAN) tablet 4 mg  4 mg Oral Q6H PRN Ihor Austin, MD       Or  . ondansetron (ZOFRAN) injection 4 mg  4 mg Intravenous Q6H PRN Pavan Pyreddy, MD      . potassium  chloride SA (K-DUR,KLOR-CON) CR tablet 40 mEq  40 mEq Oral BID Alford Highland, MD   40 mEq at 03/24/16 0835  . rifaximin (XIFAXAN) tablet 550 mg  550 mg Oral BID Alford Highland, MD   550 mg at 03/24/16 0835  . sertraline (ZOLOFT) tablet 100 mg  100 mg Oral Daily Alford Highland, MD   100 mg at 03/24/16 1000  . sodium chloride flush (NS) 0.9 % injection 3 mL  3 mL Intravenous Q12H Pavan Pyreddy, MD   3 mL at 03/24/16 1000  . tiotropium (SPIRIVA) inhalation capsule 18 mcg  18 mcg Inhalation Daily Ihor Austin, MD   18 mcg at 03/24/16 0800     Discharge Medications: Please see discharge summary for a list of discharge medications.  Relevant Imaging Results:  Relevant Lab Results:   Additional Information  (SSN: [id number])  Haig Prophet, LCSW

## 2016-03-25 LAB — BASIC METABOLIC PANEL
ANION GAP: 5 (ref 5–15)
BUN: 15 mg/dL (ref 6–20)
CALCIUM: 8.7 mg/dL — AB (ref 8.9–10.3)
CHLORIDE: 115 mmol/L — AB (ref 101–111)
CO2: 19 mmol/L — AB (ref 22–32)
CREATININE: 0.65 mg/dL (ref 0.44–1.00)
GFR calc non Af Amer: >60 mL/min (ref >60)
Glucose, Bld: 157 mg/dL — ABNORMAL HIGH (ref 65–99)
Potassium: 4.2 mmol/L (ref 3.5–5.1)
Sodium: 139 mmol/L (ref 135–145)

## 2016-03-25 LAB — URINE CULTURE: Culture: >=100000 — AB

## 2016-03-25 LAB — GLUCOSE, CAPILLARY
GLUCOSE-CAPILLARY: 153 mg/dL — AB (ref 65–99)
GLUCOSE-CAPILLARY: 226 mg/dL — AB (ref 65–99)
Glucose-Capillary: 264 mg/dL — ABNORMAL HIGH (ref 65–99)
Glucose-Capillary: 210 mg/dL — ABNORMAL HIGH (ref 65–99)

## 2016-03-25 LAB — AMMONIA: Ammonia: 42 umol/L — ABNORMAL HIGH (ref 9–35)

## 2016-03-25 MED ORDER — CEFUROXIME AXETIL 500 MG PO TABS
500.0000 mg | ORAL_TABLET | Freq: Two times a day (BID) | ORAL | Status: DC
  Administered 2016-03-25 – 2016-03-27 (×4): 500 mg via ORAL
  Filled 2016-03-25 (×4): qty 1

## 2016-03-25 NOTE — Progress Notes (Signed)
Speech Language Pathology Treatment: Dysphagia  Patient Details Name: Crystal Pratt MRN: [id number] DOB: [birth year] Today's Date: 03/25/2016 Time: 1145-1200 SLP Time Calculation (min) (ACUTE ONLY): 15 min  Assessment / Plan / Recommendation Clinical Impression  Pt's mentation continues to improve since evaluation. Nsg and pt report she has been tolerating current Dysphagia I diet w/thin liquids. Pt demonstrated no overt s/s of aspiration with thin, puree, or solid consistency. Vocal quality remained clear throughout and larygneal elevation appeared adequate. Pt able to hold cup and take several sips of thin liquid without difficulty. Pt also demonstrated good oral transit and clearing with puree consistency. Pt also observed to take several bites of solid consistency. Pt able to masticate and clear well except for one bite in which she became distracted and held bolus in her mouth for ~20 seconds after which ST verbally cued and pt then masticated and cleared bolus. Recommend upgrade diet to Dysphagia II w/thin liquids. Recommend continue with supervision of pt d/t her continued distractability. Discussed diet recommendations and treatment plan with nsg and pt. Will f/u in 1-3 days.    HPI HPI: Pt is a 60 y.o. female with a known history of liver disease, back pain, depression, diabetes mellitus, hypertension, hyperlipidemia was brought by the family member because of confusion since yesterday. Patient has been confused and lethargic since yesterday. She's been disoriented to time place and person. Patient was evaluated in the emergency room her ammonia level is high. She has been treated in the past for hepatic encephalopathy and elevated ammonia. Patient unable to give any history; completely confused and most of the history from the family member at bedside. According to family member patient is compliant with her oral lactulose medication. Currently, pt  alert requring only min. verbal cues and support  today. Pt able to respond to some questions, although does not always complete her thought. Per NSG, pt has been tolerating diet well. Pt's mentation has continues to improve from BSE;  pt able to feed self w/ min assistance.      SLP Plan  Continue with current plan of care     Recommendations  Diet recommendations: Dysphagia 2 (fine chop);Thin liquid Liquids provided via: Cup;Straw Medication Administration: Crushed with puree Supervision: Staff to assist with self feeding;Full supervision/cueing for compensatory strategies Compensations: Minimize environmental distractions;Slow rate;Small sips/bites;Follow solids with liquid Postural Changes and/or Swallow Maneuvers: Seated upright 90 degrees             Oral Care Recommendations: Oral care BID;Staff/trained caregiver to provide oral care Follow up Recommendations: None Plan: Continue with current plan of care     GO                Crystal Pratt,Crystal Pratt 03/25/2016, 12:09 PM

## 2016-03-25 NOTE — Progress Notes (Addendum)
Clinical Education officer, museum (CSW) met with patient and presented bed offers. Patient reported that she would discuss offers with her husband. Patient gave CSW permission to call her husband. CSW attempted to call patient's husband Legrand Como however he did not answer and a voicemail was left. CSW will continue to follow and assist as needed.   Patient's husband Legrand Como called CSW back. CSW presented bed offers to husband. He chose [facility]. CSW explained that [facility] will have to obtain [facility] authorization.   Blima Rich, LCSW [phone]

## 2016-03-25 NOTE — Progress Notes (Signed)
Patient ID: Crystal Pratt, female   DOB: Jun 25, 1956, 60 y.o.   MRN: [id number] [employer] PROGRESS NOTE  Crystal Pratt WUJ:[id number] DOB: Jun 25, 1956 DOA: 03/22/2016 PCP: Donnamae Jude'MEARA, CHRISTINE A, MD  HPI/Subjective: Patient sleepy this morning. But able to answer questions.  Objective: Filed Vitals:   03/25/16 0453 03/25/16 1130  BP: 97/43 104/48  Pulse: 99 100  Temp:  99.6 F (37.6 C)  Resp: 18 20    Filed Weights   03/23/16 1131 03/24/16 0553 03/25/16 0500  Weight: 76.794 kg (169 lb 4.8 oz) 76.749 kg (169 lb 3.2 oz) 81.511 kg (179 lb 11.2 oz)    ROS: Review of Systems  Unable to perform ROS Unable to provide review of systems secondary to altered mental status Exam: Physical Exam  Constitutional: She appears Chronically HENT:  Nose: No mucosal edema.  Mouth/Throat: No oropharyngeal exudate or posterior oropharyngeal edema.  Eyes: Conjunctivae and lids are normal. Pupils are equal, round, and reactive to light.  Neck: No JVD present. Carotid bruit is not present. No edema present. No thyroid mass and no thyromegaly present.  Cardiovascular: S1 normal and S2 normal.  Exam reveals no gallop.    Systolic murmur is present with a grade of 2/6  Pulses:      Dorsalis pedis pulses are 2+ on the right side, and 2+ on the left side.  Respiratory: No respiratory distress. She has no wheezes. She has no rhonchi. She has no rales.  GI: Soft. Bowel sounds are normal. She exhibits distension. There is no tenderness.  Musculoskeletal:       Right ankle: She exhibits swelling.       Left ankle: She exhibits swelling.  Lymphadenopathy:    She has no cervical adenopathy.  Neurological: She appearsawake  No focal deficits cranial nerves II-12intact Skin: Skin is warm. Nails show no clubbing.  Spider angiomas  Psychiatric:  Not anxious or depressed     Data Reviewed: Basic Metabolic Panel:  Recent Labs Lab 03/22/16 0352 03/22/16 0952 03/22/16 1609 03/23/16 0436  03/24/16 0719 03/25/16 0511  NA 139  --   --  137 143 139  K 2.6*  --  3.1* 2.8* 3.5 4.2  CL 102  --   --  105 114* 115*  CO2 23  --   --  23 22 19*  GLUCOSE 166*  --   --  210* 173* 157*  BUN 23*  --   --  18 18 15   CREATININE 1.11* 1.04*  --  0.87 0.61 0.65  CALCIUM 9.7  --   --  8.9 8.9 8.7*  MG 1.8  --   --   --  2.2  --    Liver Function Tests:  Recent Labs Lab 03/22/16 0352 03/23/16 0436  AST 300* 505*  ALT 47 71*  ALKPHOS 160* 152*  BILITOT 2.0* 1.8*  PROT 8.6* 7.6  ALBUMIN 2.6* 2.5*    Recent Labs Lab 03/22/16 0352 03/24/16 0719 03/25/16 0511  AMMONIA 81* 45* 42*   CBC:  Recent Labs Lab 03/22/16 0352 03/22/16 0952 03/23/16 0436  WBC 8.8 8.5 6.4  HGB 9.0* 8.8* 8.5*  HCT 27.0* 25.8* 25.4*  MCV 80.8 80.2 80.7  PLT 148* 138* 133*     CBG:  Recent Labs Lab 03/24/16 1206 03/24/16 1648 03/24/16 2117 03/25/16 0735 03/25/16 1128  GLUCAP 299* 220* 226* 153* 264*    Studies: No results found.  Scheduled Meds: . antiseptic oral rinse  7 mL Mouth Rinse BID  .  cefTRIAXone (ROCEPHIN)  IV  1 g Intravenous Q24H  . enoxaparin (LOVENOX) injection  40 mg Subcutaneous Q24H  . fluticasone  2 spray Each Nare Daily  . insulin aspart  0-5 Units Subcutaneous QHS  . insulin aspart  0-9 Units Subcutaneous TID WC  . insulin glargine  10 Units Subcutaneous QHS  . lactulose  30 g Oral TID  . magnesium sulfate 1 - 4 g bolus IVPB  2 g Intravenous Once  . multivitamin with minerals  1 tablet Oral Daily  . potassium chloride  40 mEq Oral BID  . rifaximin  550 mg Oral BID  . sertraline  100 mg Oral Daily  . sodium chloride flush  3 mL Intravenous Q12H  . tiotropium  18 mcg Inhalation Daily   Continuous Infusions: . 0.9 % NaCl with KCl 20 mEq / L 75 mL/hr at 03/25/16 0055    Assessment/Plan:  1. Acute hepatic encephalopathy. Continue lactulose and Xifaxan continues to improve 2. Cirrhosis of the liver.follow up with [facility] as previously 3. Type 2 diabetes  mellitus.blood sugar elevated I will increase the Lantus 4. Severe hypokalemia potassium replaced repeat BMP tomorrow 5. Increased liver function tests likely secondary to cirrhosis.  6. Acute cystitis with hematuria on pansensitive I'll discontinue Rocephin changed to Ceftin 7. Hypomagnesemia replaced  8. History of depression continue Zoloft 9. Dehydration. Improved stop IV fluids  Code Status:     Code Status Orders        Start     Ordered   03/22/16 0924  Full code   Continuous     03/22/16 0924    Code Status History    Date Active Date Inactive Code Status Order ID Comments User Context   This patient has a current code status but no historical code status.     Family Communication: Family at the bedside Disposition Plan: To be determined  Antibiotics:  rocephin  Time spent: 25 minutes  Tambra Muller, [facility]  [employer]

## 2016-03-26 LAB — BASIC METABOLIC PANEL
ANION GAP: 6 (ref 5–15)
BUN: 12 mg/dL (ref 6–20)
CALCIUM: 8.9 mg/dL (ref 8.9–10.3)
CO2: 19 mmol/L — ABNORMAL LOW (ref 22–32)
Chloride: 115 mmol/L — ABNORMAL HIGH (ref 101–111)
Creatinine, Ser: 0.6 mg/dL (ref 0.44–1.00)
GFR calc non Af Amer: >60 mL/min (ref >60)
GLUCOSE: 191 mg/dL — AB (ref 65–99)
POTASSIUM: 4.1 mmol/L (ref 3.5–5.1)
Sodium: 140 mmol/L (ref 135–145)

## 2016-03-26 LAB — GLUCOSE, CAPILLARY
GLUCOSE-CAPILLARY: 224 mg/dL — AB (ref 65–99)
GLUCOSE-CAPILLARY: 205 mg/dL — AB (ref 65–99)
Glucose-Capillary: 154 mg/dL — ABNORMAL HIGH (ref 65–99)
Glucose-Capillary: 259 mg/dL — ABNORMAL HIGH (ref 65–99)

## 2016-03-26 MED ORDER — ALUM & MAG HYDROXIDE-SIMETH 200-200-20 MG/5ML PO SUSP
30.0000 mL | Freq: Four times a day (QID) | ORAL | Status: DC | PRN
  Administered 2016-03-26: 30 mL via ORAL
  Filled 2016-03-26: qty 30

## 2016-03-26 NOTE — Progress Notes (Addendum)
Patient ID: Crystal StackDebra M Lukin, female   DOB: Apr 17, 1956, 60 y.o.   MRN: [id number] [employer] PROGRESS NOTE  Crystal Pratt [id number] DOB: Apr 17, 1956 DOA: 03/22/2016 PCP: Donnamae Jude'MEARA, CHRISTINE A, MD  HPI/Subjective: More awake but weak denies any complaints  Objective: Filed Vitals:   03/26/16 0505 03/26/16 1119  BP: 118/44 129/47  Pulse: 89 85  Temp: 98.4 F (36.9 C) 97.8 F (36.6 C)  Resp: 18 16    Filed Weights   03/24/16 0553 03/25/16 0500 03/26/16 0500  Weight: 76.749 kg (169 lb 3.2 oz) 81.511 kg (179 lb 11.2 oz) 81.511 kg (179 lb 11.2 oz)    ROS: Review of Systems  . CONSTITUTIONAL: No documented fever. No fatigue, weakness. No weight gain, no weight loss.  EYES: No blurry or double vision.  ENT: No tinnitus. No postnasal drip. No redness of the oropharynx.  RESPIRATORY: No cough, no wheeze, no hemoptysis. No dyspnea.  CARDIOVASCULAR: No chest pain. No orthopnea. No palpitations. No syncope.  GASTROINTESTINAL: No nausea, no vomiting or diarrhea. No abdominal pain. No melena or hematochezia.  GENITOURINARY:  No urgency. No frequency. No dysuria. No hematuria. No obstructive symptoms. No discharge. No pain. No significant abnormal bleeding ENDOCRINE: No polyuria or nocturia. No heat or cold intolerance.  HEMATOLOGY: No anemia. No bruising. No bleeding. No purpura. No petechiae INTEGUMENTARY: No rashes. No lesions.  MUSCULOSKELETAL: No arthritis. No swelling. No gout.  NEUROLOGIC: No numbness, tingling, or ataxia. No seizure-type activity.  PSYCHIATRIC: No anxiety. No insomnia. No ADD.      Exam: Physical Exam  Constitutional: She appears Chronically HENT:  Nose: No mucosal edema.  Mouth/Throat: No oropharyngeal exudate or posterior oropharyngeal edema.  Eyes: Conjunctivae and lids are normal. Pupils are equal, round, and reactive to light.  Neck: No JVD present. Carotid bruit is not present. No edema present. No thyroid mass and no thyromegaly present.   Cardiovascular: S1 normal and S2 normal.  Exam reveals no gallop.    Systolic murmur is present with a grade of 2/6  Pulses:      Dorsalis pedis pulses are 2+ on the right side, and 2+ on the left side.  Respiratory: No respiratory distress. She has no wheezes. She has no rhonchi. She has no rales.  GI: Soft. Bowel sounds are normal. She exhibits distension. There is no tenderness.  Musculoskeletal:       Right ankle: She exhibits swelling.       Left ankle: She exhibits swelling.  Lymphadenopathy:    She has no cervical adenopathy.  Neurological: She appearsawake  No focal deficits cranial nerves II-12intact patient more awake and alert. Skin: Skin is warm. Nails show no clubbing.  Spider angiomas  Psychiatric:  Not anxious or depressed     Data Reviewed: Basic Metabolic Panel:  Recent Labs Lab 03/22/16 0352 03/22/16 0952 03/22/16 1609 03/23/16 0436 03/24/16 0719 03/25/16 0511 03/26/16 0428  NA 139  --   --  137 143 139 140  K 2.6*  --  3.1* 2.8* 3.5 4.2 4.1  CL 102  --   --  105 114* 115* 115*  CO2 23  --   --  23 22 19* 19*  GLUCOSE 166*  --   --  210* 173* 157* 191*  BUN 23*  --   --  18 18 15 12   CREATININE 1.11* 1.04*  --  0.87 0.61 0.65 0.60  CALCIUM 9.7  --   --  8.9 8.9 8.7* 8.9  MG 1.8  --   --   --  2.2  --   --    Liver Function Tests:  Recent Labs Lab 03/22/16 0352 03/23/16 0436  AST 300* 505*  ALT 47 71*  ALKPHOS 160* 152*  BILITOT 2.0* 1.8*  PROT 8.6* 7.6  ALBUMIN 2.6* 2.5*    Recent Labs Lab 03/22/16 0352 03/24/16 0719 03/25/16 0511  AMMONIA 81* 45* 42*   CBC:  Recent Labs Lab 03/22/16 0352 03/22/16 0952 03/23/16 0436  WBC 8.8 8.5 6.4  HGB 9.0* 8.8* 8.5*  HCT 27.0* 25.8* 25.4*  MCV 80.8 80.2 80.7  PLT 148* 138* 133*     CBG:  Recent Labs Lab 03/25/16 0735 03/25/16 1128 03/25/16 1619 03/25/16 2152 03/26/16 0757  GLUCAP 153* 264* 226* 210* 154*    Studies: No results found.  Scheduled Meds: . antiseptic  oral rinse  7 mL Mouth Rinse BID  . cefUROXime  500 mg Oral BID WC  . enoxaparin (LOVENOX) injection  40 mg Subcutaneous Q24H  . fluticasone  2 spray Each Nare Daily  . insulin aspart  0-5 Units Subcutaneous QHS  . insulin aspart  0-9 Units Subcutaneous TID WC  . insulin glargine  10 Units Subcutaneous QHS  . lactulose  30 g Oral TID  . magnesium sulfate 1 - 4 g bolus IVPB  2 g Intravenous Once  . multivitamin with minerals  1 tablet Oral Daily  . potassium chloride  40 mEq Oral BID  . rifaximin  550 mg Oral BID  . sertraline  100 mg Oral Daily  . sodium chloride flush  3 mL Intravenous Q12H  . tiotropium  18 mcg Inhalation Daily   Continuous Infusions:    Assessment/Plan:  1. Acute hepatic encephalopathy. Continue lactulose and Xifaxan Appears to be back to baseline very weak 2. Cirrhosis of the liver.follow up with [facility] as previously 3. Type 2 diabetes mellitus.slightly higher dose Lantus 4. Severe hypokalemia potassium replaced  5. Increased liver function tests likely secondary to cirrhosis.  6. Acute cystitis with hematuria on continue oral Ceftin 7. Hypomagnesemia replaced  8. History of depression continue Zoloft 9. Dehydration. Improved stop IV fluids  Code Status:     Code Status Orders        Start     Ordered   03/22/16 0924  Full code   Continuous     03/22/16 0924    Code Status History    Date Active Date Inactive Code Status Order ID Comments User Context   This patient has a current code status but no historical code status.     Family Communication:  Will d/w husband Disposition Plan: To be determined  Antibiotics:  rocephin  Time spent: 25 minutes  Tamantha Saline, [facility]  [employer]

## 2016-03-27 LAB — GLUCOSE, CAPILLARY
Glucose-Capillary: 169 mg/dL — ABNORMAL HIGH (ref 65–99)
Glucose-Capillary: 244 mg/dL — ABNORMAL HIGH (ref 65–99)

## 2016-03-27 MED ORDER — RIFAXIMIN 550 MG PO TABS: 550.0000 mg | ORAL_TABLET | Freq: Two times a day (BID) | ORAL | Status: DC

## 2016-03-27 MED ORDER — INSULIN GLARGINE 100 UNIT/ML ~~LOC~~ SOLN: 10.0000 [IU] | Freq: Every day | SUBCUTANEOUS | Status: DC

## 2016-03-27 MED ORDER — LACTULOSE 10 GM/15ML PO SOLN: 30.0000 g | Freq: Two times a day (BID) | ORAL | Status: AC

## 2016-03-27 MED ORDER — CEFUROXIME AXETIL 500 MG PO TABS: 500.0000 mg | ORAL_TABLET | Freq: Two times a day (BID) | ORAL | Status: DC

## 2016-03-27 MED ORDER — AMOXICILLIN 500 MG PO TABS: 500.0000 mg | ORAL_TABLET | Freq: Two times a day (BID) | ORAL | Status: AC

## 2016-03-27 MED ORDER — AMOXICILLIN 500 MG PO TABS: 500.0000 mg | ORAL_TABLET | Freq: Two times a day (BID) | ORAL | Status: DC

## 2016-03-27 MED ORDER — RIFAXIMIN 550 MG PO TABS: 550.0000 mg | ORAL_TABLET | Freq: Two times a day (BID) | ORAL | Status: AC

## 2016-03-27 NOTE — Progress Notes (Signed)
Physical Therapy Treatment Patient Details Name: Crystal StackDebra M Pratt MRN: [id number] DOB: November 05, 1956 Today's Date: 03/27/2016    History of Present Illness 60 yo F presented to ED on 5/3 after husband found her having rolled OOB on the floor and couldn't get her up. She was also presenting with increased confusion. Per husband she has had decreased functional mobility the past few weeks with medical complications. PMH includes liver disease and DM.    PT Comments    Pt demonstrated improved cognition, but some confusion is present. She continues to have a flat affect with a gaze and occasional inattention to task, requiring cues for staying alert and on task. Pt gradually progressing towards goals. She was able to ambulate up to 75 ft with FWW and min A. She is much more steady with use of FWW. Pt often bumping into objects and requires cues for navigation and safety. Current recommendations of STR remain appropriate.  Follow Up Recommendations  SNF     Equipment Recommendations  Rolling walker with 5" wheels    Recommendations for Other Services       Precautions / Restrictions Precautions Precautions: Fall Restrictions Weight Bearing Restrictions: No    Mobility  Bed Mobility Overal bed mobility: Needs Assistance Bed Mobility: Supine to Sit     Supine to sit: Min guard     General bed mobility comments: uses rail  Transfers Overall transfer level: Needs assistance Equipment used: Rolling walker (2 wheeled) Transfers: Sit to/from [employer] CorporationStand;Stand Pivot Transfers Sit to Stand: Min guard Stand pivot transfers: Min guard       General transfer comment: Heavy cues for hand placement, navigation and staying on task  Ambulation/Gait Ambulation/Gait assistance: Min assist Ambulation Distance (Feet): 75 Feet Assistive device: Rolling walker (2 wheeled) Gait Pattern/deviations: Decreased stride length;Drifts right/left;Trunk flexed;Narrow base of support Gait velocity:  reduced Gait velocity interpretation: <1.8 ft/sec, indicative of risk for recurrent falls General Gait Details: Demonstrated difficulty staying on task. Unsteady, slow gait. Heavy cues for navigation and safety. Often bumping into obstacles.   Stairs            Wheelchair Mobility    Modified Rankin (Stroke Patients Only)       Balance Overall balance assessment: Needs assistance;History of Falls Sitting-balance support: Feet supported;Bilateral upper extremity supported Sitting balance-Leahy Scale: Good     Standing balance support: Bilateral upper extremity supported Standing balance-Leahy Scale: Fair Standing balance comment: needs B UE for stability                    Cognition Arousal/Alertness: Lethargic Behavior During Therapy: Flat affect Overall Cognitive Status: Impaired/Different from baseline Area of Impairment: Orientation;Attention;Memory;Following commands;Safety/judgement;Awareness Orientation Level: Place;Time;Situation Current Attention Level: Alternating   Following Commands: Follows one step commands inconsistently       General Comments: Pt is improved since last PT session, but some confusion remains.    Exercises Other Exercises Other Exercises: B LE therex: supine: ankle pumps, heel slides, hip abd slides; seated LAQs, marching, hip add squeezes, hip abd and heel slides with manual resistance x15 each. Cues for staying on task and technique of exercise.  Other Exercises: Pt ambulated 75 ft with FWW and min A. Heavy verbal and tactile cues for navigation and safety. Pt with increased stability using FWw.    General Comments General comments (skin integrity, edema, etc.): Pt improved from last session but some confusion remains.      Pertinent Vitals/Pain Pain Assessment: No/denies pain    Home  Living                      Prior Function            PT Goals (current goals can now be found in the care plan section)  Acute Rehab PT Goals Patient Stated Goal: "i'm going to rehab" PT Goal Formulation: With family Time For Goal Achievement: 04/07/16 Potential to Achieve Goals: Fair Progress towards PT goals: Progressing toward goals    Frequency  Min 2X/week    PT Plan Current plan remains appropriate    Co-evaluation             End of Session Equipment Utilized During Treatment: Gait belt Activity Tolerance: Patient tolerated treatment well;Patient limited by lethargy Patient left: in chair;with call bell/phone within reach;with chair alarm set     Time: 1610-9604 PT Time Calculation (min) (ACUTE ONLY): 24 min  Charges:  $Gait Training: 8-22 mins $Therapeutic Exercise: 8-22 mins                    G Codes:      Adelene Idler, PT, DPT  03/27/2016, 12:43 PM [phone]

## 2016-03-27 NOTE — Discharge Summary (Addendum)
Crystal Pratt, 60 y.o., DOB 01/31/1956, MRN [id number]. Admission date: 03/22/2016 Discharge Date 03/27/2016 Primary MD Donnamae Jude, MD Admitting Physician Ihor Austin, MD  Admission Diagnosis  Hepatic encephalopathy (HCC) [K72.90] Hypokalemia [E87.6]  Discharge Diagnosis   Principal Problem:   Acute Hepatic encephalopathy (HCC) Cirrhosis of the liver   Hypokalemia Diabetes type 2 Elevated liver function test due to #2 Acute cystitis Hypomagnesemia Depression         Hospital Course  HISTORY OF PRESENT ILLNESS: Crystal Pratt is a 60 y.o. female with a known history of liver disease, diabetes mellitus, hypertension, hyperlipidemia was brought by the family member because of confusion for one days duration. Patient was seen in the emergency room and was noted to have elevated ammonia level. This was admitted for hepatic encephalopathy started on higher dose lactulose and Xifaxan. Patient's ammonia level level started trending down. She started feeling better. Patient's mental status slowly started improving. She is doing much better.            Consults  None  Significant Tests:  See full reports for all details    Ct Head Wo Contrast  03/22/2016  CLINICAL DATA:  Syncope and fall.  Increased weakness. EXAM: CT HEAD WITHOUT CONTRAST TECHNIQUE: Contiguous axial images were obtained from the base of the skull through the vertex without intravenous contrast. COMPARISON:  None. FINDINGS: Ventricles and sulci are symmetrical. No mass effect or midline shift. No abnormal extra-axial fluid collections. Gray-white matter junctions are distinct. Basal cisterns are not effaced. No evidence of acute intracranial hemorrhage. No depressed skull fractures. Visualized paranasal sinuses and mastoid air cells are not opacified. Vascular calcifications. IMPRESSION: No acute intracranial abnormalities. Electronically Signed   By: Burman Nieves M.D.   On: 03/22/2016 06:34       Today    Subjective:   Crystal Pratt  patient feeling better more awake but very weak  Objective:   Blood pressure 109/50, pulse 96, temperature 98.6 F (37 C), temperature source Oral, resp. rate 16, height 5\' 3"  (1.6 m), weight 78.563 kg (173 lb 3.2 oz), SpO2 95 %.  .  Intake/Output Summary (Last 24 hours) at 03/27/16 1411 Last data filed at 03/27/16 1357  Gross per 24 hour  Intake      0 ml  Output      0 ml  Net      0 ml    Exam VITAL SIGNS: Blood pressure 109/50, pulse 96, temperature 98.6 F (37 C), temperature source Oral, resp. rate 16, height 5\' 3"  (1.6 m), weight 78.563 kg (173 lb 3.2 oz), SpO2 95 %.  GENERAL:  60 y.o.-year-old patient lying in the bed with no acute distress.  EYES: Pupils equal, round, reactive to light and accommodation. No scleral icterus. Extraocular muscles intact.  HEENT: Head atraumatic, normocephalic. Oropharynx and nasopharynx clear.  NECK:  Supple, no jugular venous distention. No thyroid enlargement, no tenderness.  LUNGS: Normal breath sounds bilaterally, no wheezing, rales,rhonchi or crepitation. No use of accessory muscles of respiration.  CARDIOVASCULAR: S1, S2 normal. No murmurs, rubs, or gallops.  ABDOMEN: Soft, nontender, nondistended. Bowel sounds present. No organomegaly or mass.  EXTREMITIES: No pedal edema, cyanosis, or clubbing.  NEUROLOGIC: Cranial nerves II through XII are intact. Muscle strength 5/5 in all extremities. Sensation intact. Gait not checked.  PSYCHIATRIC: The patient is alert and oriented x 3.  SKIN: No obvious rash, lesion, or ulcer.   Data Review     CBC w Diff:  Lab Results  Component Value Date   WBC 6.4 03/23/2016   HGB 8.5* 03/23/2016   HCT 25.4* 03/23/2016   PLT 133* 03/23/2016   LYMPHOPCT 28 12/02/2015   MONOPCT 10 12/02/2015   EOSPCT 3 12/02/2015   BASOPCT 1 12/02/2015   CMP:  Lab Results  Component Value Date   NA 140 03/26/2016   K 4.1 03/26/2016   CL 115* 03/26/2016   CO2 19* 03/26/2016    BUN 12 03/26/2016   CREATININE 0.60 03/26/2016   PROT 7.6 03/23/2016   ALBUMIN 2.5* 03/23/2016   BILITOT 1.8* 03/23/2016   ALKPHOS 152* 03/23/2016   AST 505* 03/23/2016   ALT 71* 03/23/2016  .  Micro Results Recent Results (from the past 240 hour(s))  Urine culture     Status: Abnormal   Collection Time: 03/22/16  3:43 AM  Result Value Ref Range Status   Specimen Description URINE, CLEAN CATCH  Final   Special Requests NONE  Final   Culture (A)  Final    >=100,000 COLONIES/mL ESCHERICHIA COLI >=100,000 COLONIES/mL ENTEROCOCCUS FAECIUM    Report Status 03/25/2016 FINAL  Final   Organism ID, Bacteria ESCHERICHIA COLI (A)  Final   Organism ID, Bacteria ENTEROCOCCUS FAECIUM (A)  Final      Susceptibility   Escherichia coli - MIC*    AMPICILLIN <=2 SENSITIVE Sensitive     CEFAZOLIN <=4 SENSITIVE Sensitive     CEFTRIAXONE <=1 SENSITIVE Sensitive     CIPROFLOXACIN <=0.25 SENSITIVE Sensitive     GENTAMICIN <=1 SENSITIVE Sensitive     IMIPENEM <=0.25 SENSITIVE Sensitive     NITROFURANTOIN <=16 SENSITIVE Sensitive     TRIMETH/SULFA <=20 SENSITIVE Sensitive     AMPICILLIN/SULBACTAM <=2 SENSITIVE Sensitive     PIP/TAZO <=4 SENSITIVE Sensitive     Extended ESBL NEGATIVE Sensitive     * >=100,000 COLONIES/mL ESCHERICHIA COLI   Enterococcus faecium - MIC*    AMPICILLIN <=2 SENSITIVE Sensitive     LEVOFLOXACIN 4 INTERMEDIATE Intermediate     NITROFURANTOIN 64 INTERMEDIATE Intermediate     VANCOMYCIN <=0.5 SENSITIVE Sensitive     LINEZOLID 2 SENSITIVE Sensitive     * >=100,000 COLONIES/mL ENTEROCOCCUS FAECIUM        Code Status Orders        Start     Ordered   03/22/16 0924  Full code   Continuous     03/22/16 0924    Code Status History    Date Active Date Inactive Code Status Order ID Comments User Context   This patient has a current code status but no historical code status.              Follow-up Information    Follow up with O'MEARA, CHRISTINE A, MD In 10  days.   Specialty:  Family Medicine   Why:  You will need to call for an appointment, ccs   Contact information:   [address] [place] [address] [phone]       Discharge Medications     Medication List    TAKE these medications        albuterol 108 (90 Base) MCG/ACT inhaler  Commonly known as:  PROVENTIL HFA;VENTOLIN HFA  Inhale 2 puffs into the lungs every 4 (four) hours as needed for wheezing or shortness of breath.     amoxicillin 500 MG tablet  Commonly known as:  AMOXIL  Take 1 tablet (500 mg total) by mouth 2 (two) times daily.     aspirin 81  MG tablet  Take 81 mg by mouth daily.     enalapril 2.5 MG tablet  Commonly known as:  VASOTEC  Take 2.5 mg by mouth daily.     fluticasone 50 MCG/ACT nasal spray  Commonly known as:  FLONASE  Place 2 sprays into both nostrils daily.     furosemide 20 MG tablet  Commonly known as:  LASIX  Take 60 mg by mouth daily.     gabapentin 300 MG capsule  Commonly known as:  NEURONTIN  Take 300 mg by mouth 2 (two) times daily.     Insulin Degludec 200 UNIT/ML Sopn  Inject 80 Units into the skin at bedtime.     ipratropium-albuterol 0.5-2.5 (3) MG/3ML Soln  Commonly known as:  DUONEB  Take 3 mLs by nebulization every 4 (four) hours as needed.     lactulose 10 GM/15ML solution  Commonly known as:  CHRONULAC  Take 45 mLs (30 g total) by mouth 2 (two) times daily.     loratadine 10 MG tablet  Commonly known as:  CLARITIN  Take 10 mg by mouth daily.     metFORMIN 1000 MG tablet  Commonly known as:  GLUCOPHAGE  Take 500 mg by mouth 2 (two) times daily with a meal.     montelukast 10 MG tablet  Commonly known as:  SINGULAIR  Take 10 mg by mouth at bedtime.     multivitamin capsule  Take 1 capsule by mouth daily.     omega-3 fish oil 1000 MG Caps capsule  Commonly known as:  MAXEPA  Take 1 capsule by mouth daily.     omeprazole 40 MG capsule  Commonly known as:  PRILOSEC  Take 40 mg by mouth daily.      potassium chloride SA 20 MEQ tablet  Commonly known as:  K-DUR,KLOR-CON  Take 10 mEq by mouth daily.     rifaximin 550 MG Tabs tablet  Commonly known as:  XIFAXAN  Take 1 tablet (550 mg total) by mouth 2 (two) times daily.     rosuvastatin 20 MG tablet  Commonly known as:  CRESTOR  Take 20 mg by mouth daily.     sertraline 100 MG tablet  Commonly known as:  ZOLOFT  Take 100 mg by mouth daily.     spironolactone 50 MG tablet  Commonly known as:  ALDACTONE  Take 150 mg by mouth daily.     tiotropium 18 MCG inhalation capsule  Commonly known as:  SPIRIVA  Place 18 mcg into inhaler and inhale daily.     VICTOZA 18 MG/3ML Sopn  Generic drug:  Liraglutide  Inject 1.8 mg into the skin daily.           Total Time in preparing paper work, data evaluation and todays exam - 35 minutes  Auburn BilberryPATEL, Jiana Lemaire M.D on 03/27/2016 at 2:11 PM  [facility]   Office  [phone]

## 2016-03-27 NOTE — Discharge Instructions (Signed)
°  DIET:  Diabetic diet, cardiac diet  DISCHARGE CONDITION:  Stable  ACTIVITY:  Activity as tolerated  OXYGEN:  Home Oxygen: No.   Oxygen Delivery: room air  DISCHARGE LOCATION:  Home    ADDITIONAL DISCHARGE INSTRUCTION:   If you experience worsening of your admission symptoms, develop shortness of breath, life threatening emergency, suicidal or homicidal thoughts you must seek medical attention immediately by calling 911 or calling your MD immediately  if symptoms less severe.  You Must read complete instructions/literature along with all the possible adverse reactions/side effects for all the Medicines you take and that have been prescribed to you. Take any new Medicines after you have completely understood and accpet all the possible adverse reactions/side effects.   Please note  You were cared for by a hospitalist during your hospital stay. If you have any questions about your discharge medications or the care you received while you were in the hospital after you are discharged, you can call the unit and asked to speak with the hospitalist on call if the hospitalist that took care of you is not available. Once you are discharged, your primary care physician will handle any further medical issues. Please note that NO REFILLS for any discharge medications will be authorized once you are discharged, as it is imperative that you return to your primary care physician (or establish a relationship with a primary care physician if you do not have one) for your aftercare needs so that they can reassess your need for medications and monitor your lab values.

## 2016-03-27 NOTE — Care Management Note (Signed)
Case Management Note  Patient Details  Name: Crystal Pratt MRN: [id number] Date of Birth: 17-Feb-1956  Subjective/Objective:     A [facility] for home health PT and RN [facility]. Mr Myrtis HoppingMcNair reports no choice of providers.              Action/Plan:   Expected Discharge Date:                  Expected Discharge Plan:     In-House Referral:     Discharge planning Services  CM Consult  Post Acute Care Choice:    Choice offered to:  Spouse  DME Arranged:    DME Agency:     HH Arranged:    HH Agency:     Status of Service:  In process, will continue to follow  Medicare Important Message Given:    Date Medicare IM Given:    Medicare IM give by:    Date Additional Medicare IM Given:    Additional Medicare Important Message give by:     If discussed at Long Length of Stay Meetings, dates discussed:    Additional Comments:  Morghan Kester A, RN 03/27/2016, 2:43 PM

## 2016-03-27 NOTE — Progress Notes (Signed)
Discharge instructions explained to pt and pts spouse/ verbalized an understanding/ iv and tele removed/ will transport off unit via wheelchair/ per Care Raoul PitchMang, [place], home health has been set up.

## 2016-03-27 NOTE — Clinical Social Work Note (Signed)
Patient and husband have decided to return home with home health. CSW informed husband that [employer] was willing to take what is called a 5 day letter of guarantee while waiting on [employer] authorization. CSW explained this to him. Patient and husband have decided to go home rather than continue on with STR. York SpanielMonica Tavaras Goody MSW,LCSW [phone]

## 2016-03-29 NOTE — Progress Notes (Signed)
[facility]  Patient Status: closed, patient declined services   If patient discharges after hours, please call [phone].   Crystal CaseyJason E Hinton 03/29/2016, 5:39 PM

## 2016-07-21 DEATH — deceased

## 2016-12-24 IMAGING — CT CT HEAD W/O CM
4 series · 19 of 30 positions shown, 20 images · non-contrast
Comparison: None.

CLINICAL DATA: Syncope and fall.  Increased weakness.

EXAM:
CT HEAD WITHOUT CONTRAST
TECHNIQUE: Contiguous axial images were obtained from the base of the skull
through the vertex without intravenous contrast.

[Series 2: head bone · axial · 0.46mm/px · z∈[-174,-22]mm · 8 of 92 slices shown]
[im 8/92  bone]
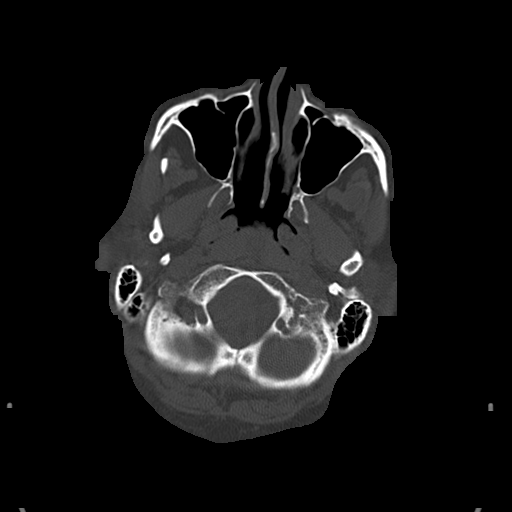
[im 23/92  bone]
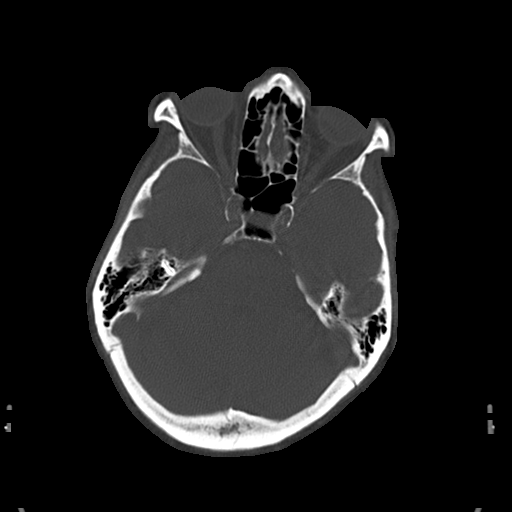
[im 31/92  bone]
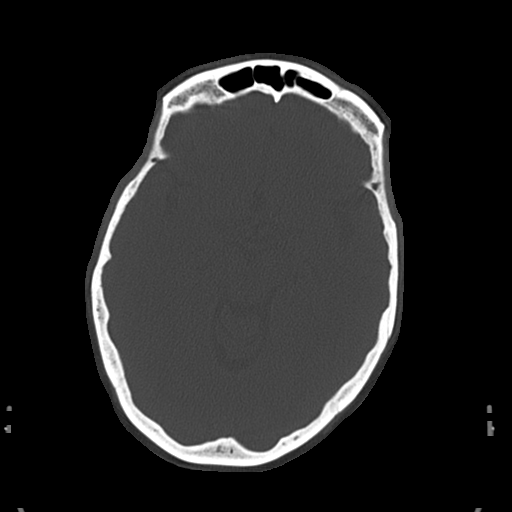
[im 38/92  bone]
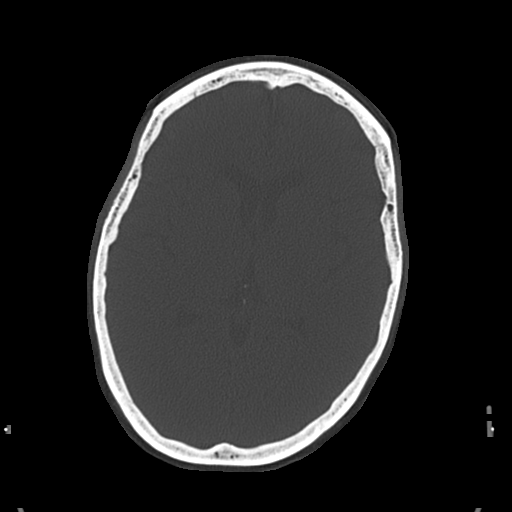
[im 54/92  bone]
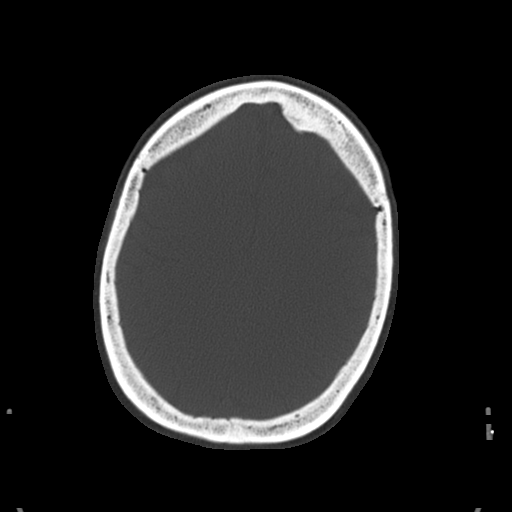
[im 61/92  bone]
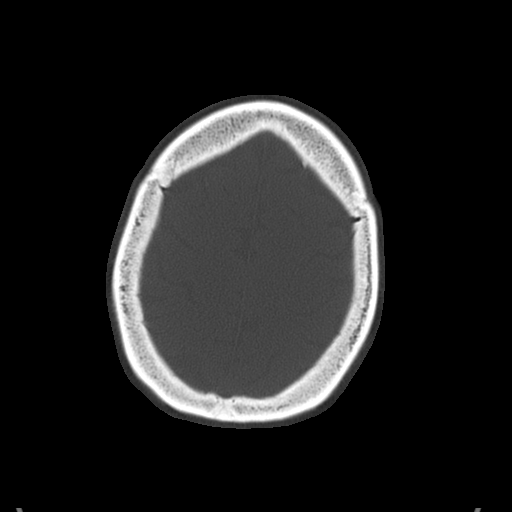
[im 69/92  bone]
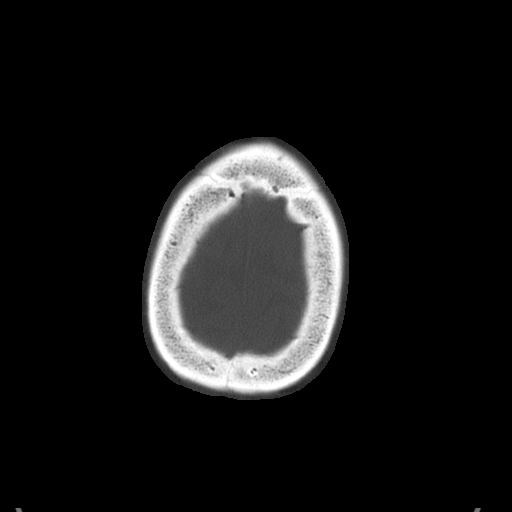
[im 84/92  bone]
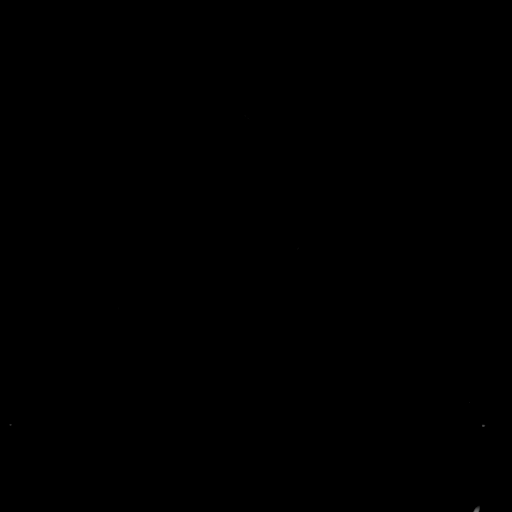

[Series 3: head wo · axial · 0.46mm/px · z∈[-129,-74]mm · 2 of 33 slices shown, 3 images]
[im 11/33  brain]
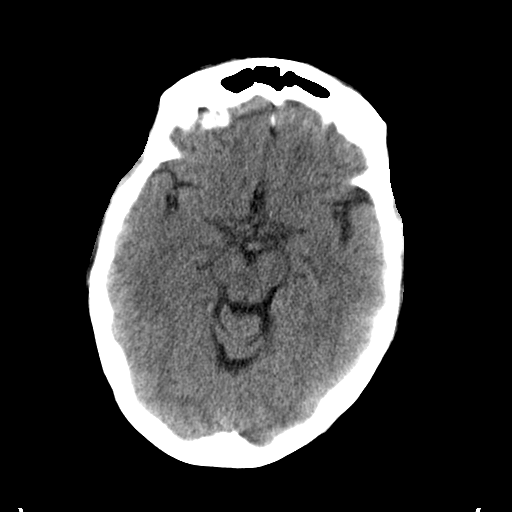
[im 11/33  bone]
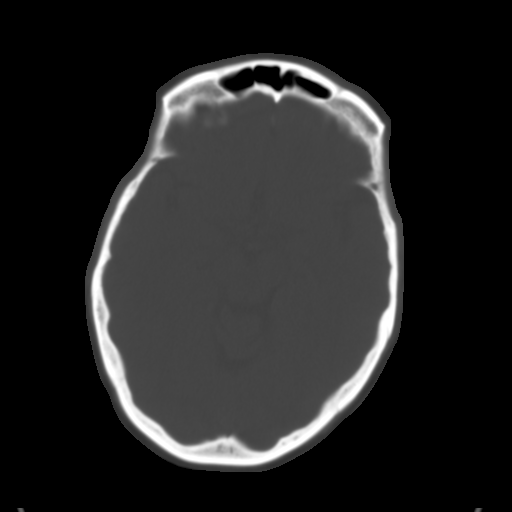
[im 22/33  brain]
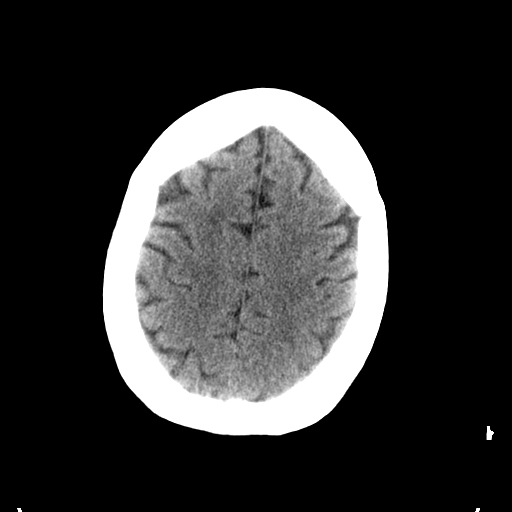

[Series 4: head wo recon · axial · 0.41mm/px · z∈[-81,-39]mm · 2 of 29 slices shown]
[im 10/29  brain]
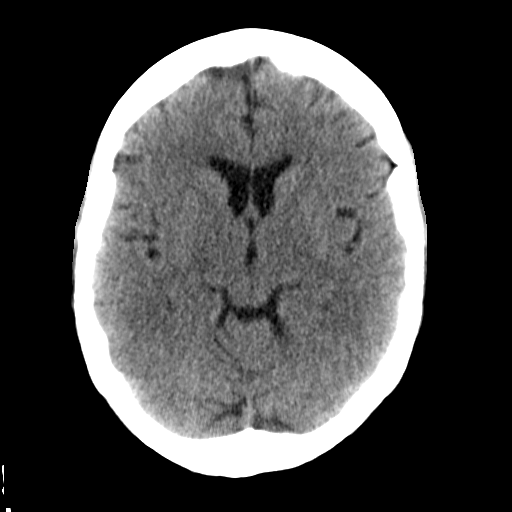
[im 19/29  brain]
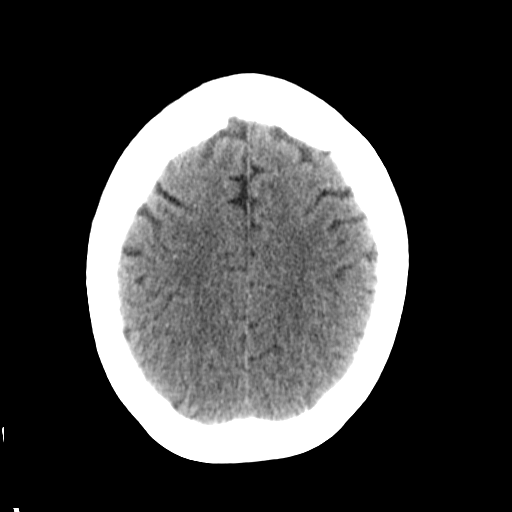

[Series 5: head bone recons · axial · 0.41mm/px · z∈[-111,-12]mm · 7 of 71 slices shown]
[im 9/71  bone]
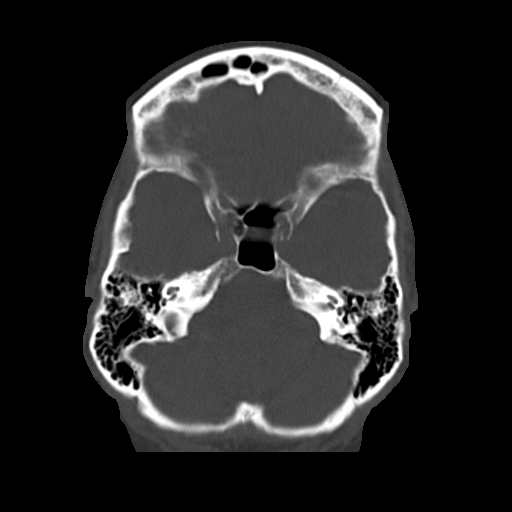
[im 18/71  bone]
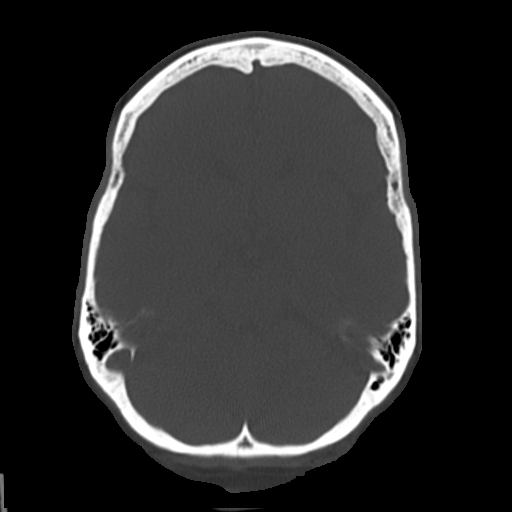
[im 27/71  bone]
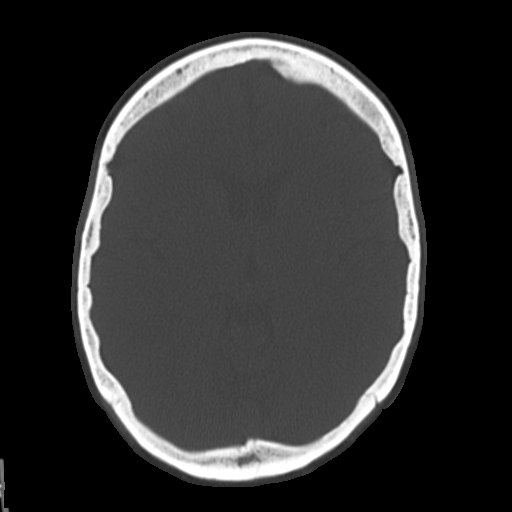
[im 36/71  bone]
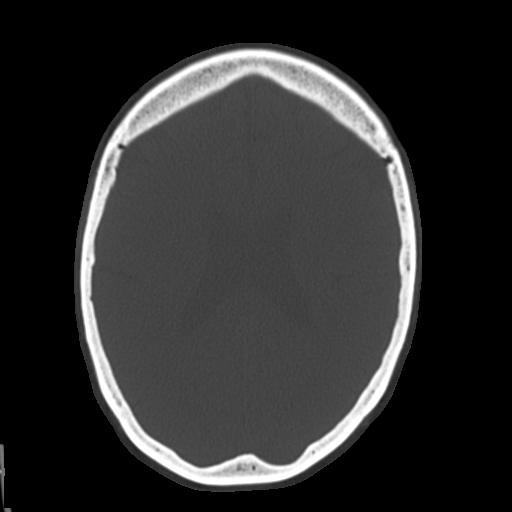
[im 44/71  bone]
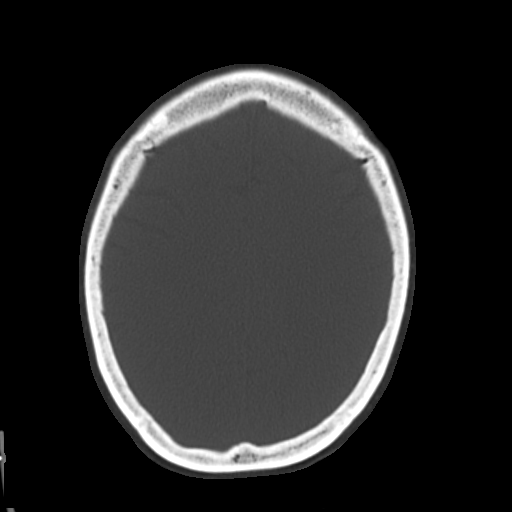
[im 53/71  bone]
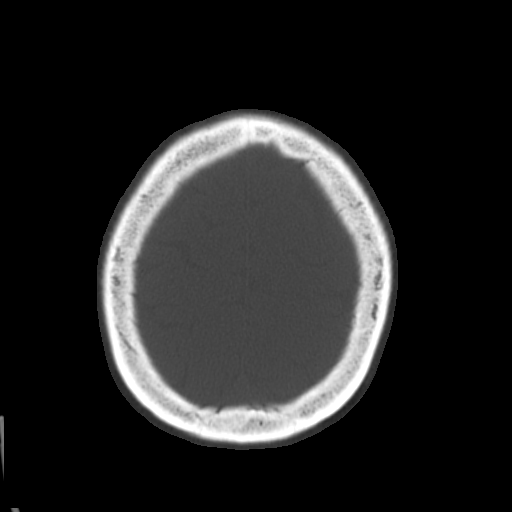
[im 62/71  bone]
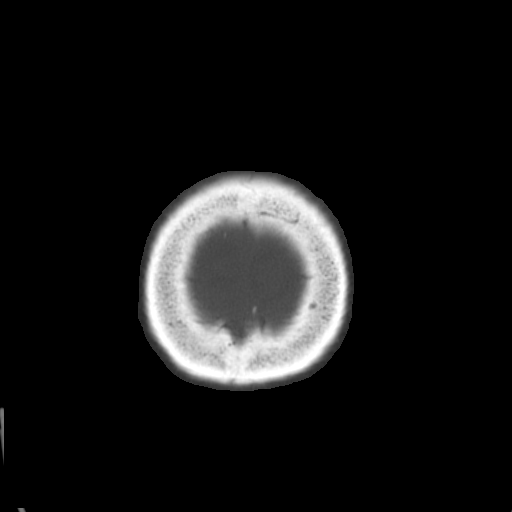

[19 of 30 positions shown; findings below may reference images not displayed]

FINDINGS: Ventricles and sulci are symmetrical. No mass effect or midline
shift. No abnormal extra-axial fluid collections. Gray-white matter
junctions are distinct. Basal cisterns are not effaced. No evidence
of acute intracranial hemorrhage. No depressed skull fractures.
Visualized paranasal sinuses and mastoid air cells are not
opacified. Vascular calcifications.
IMPRESSION: No acute intracranial abnormalities.

## 2017-01-16 IMAGING — US US BREAST LTD UNI LEFT INC AXILLA
1 series · 4 of 4 positions shown · non-contrast
Comparison: Previous exam(s).

CLINICAL DATA: Patient recalled from screening for possible left
breast mass.

EXAM:
DIGITAL DIAGNOSTIC LEFT MAMMOGRAM WITH 3D TOMOSYNTHESIS WITH CAD
ULTRASOUND LEFT BREAST

[Series 1: us breast ltd uni left inc axilla · 0.09mm/px · 4 of 4 slices shown]
[im 1/4]
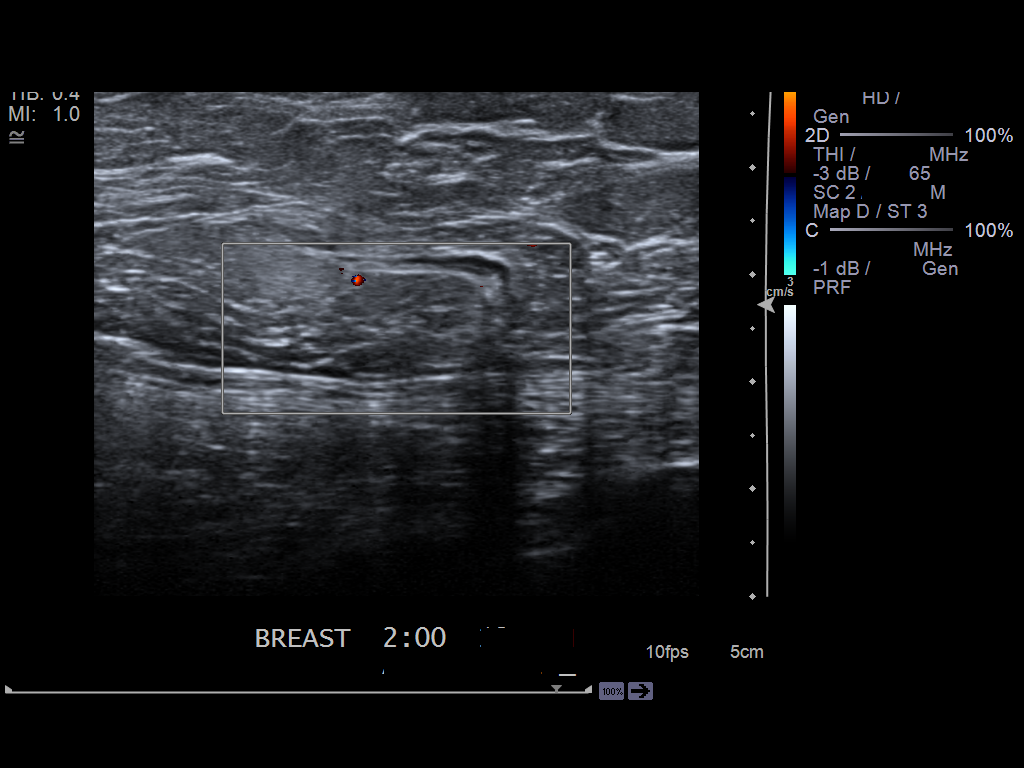
[im 2/4]
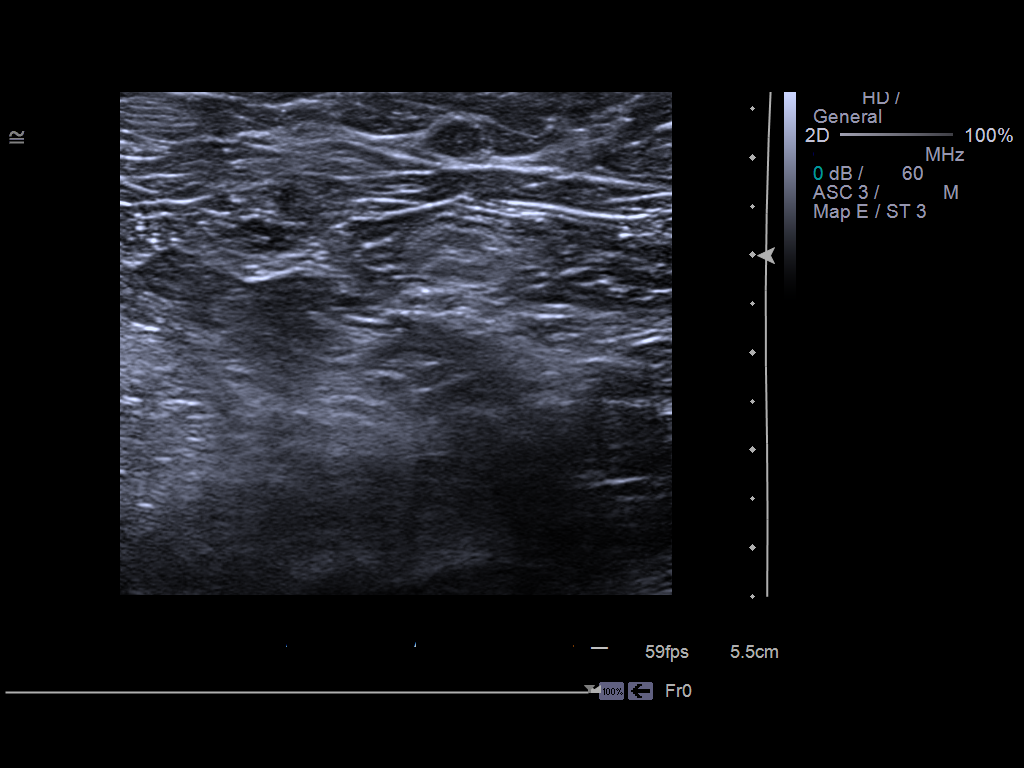
[im 3/4]
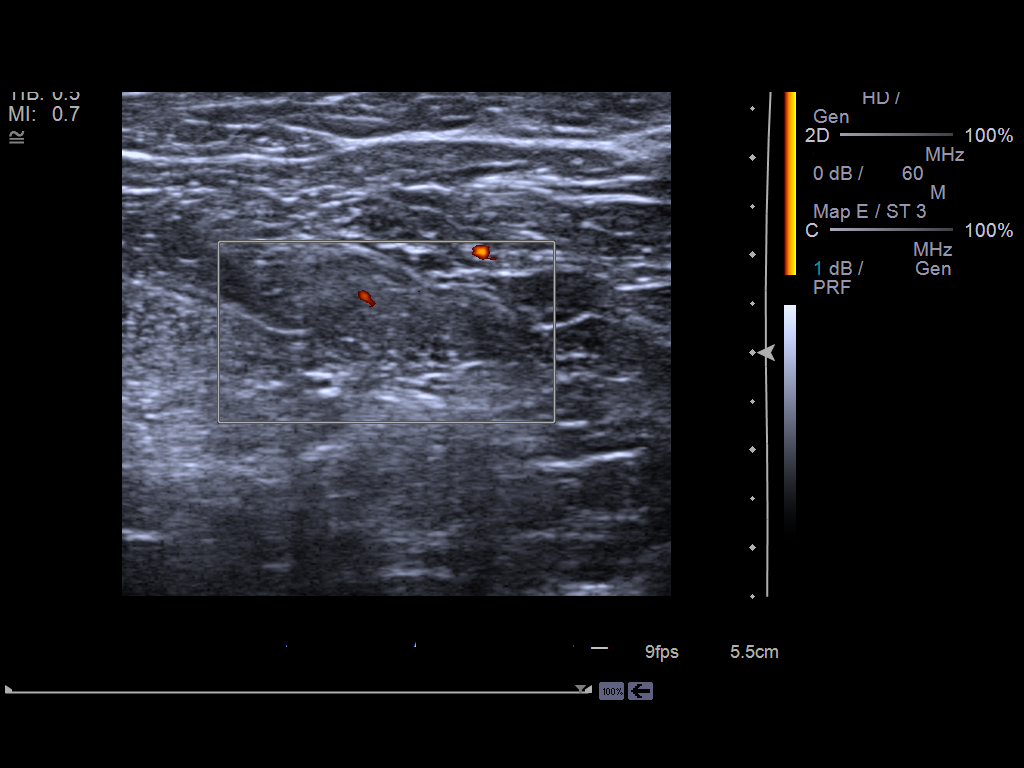
[im 4/4]
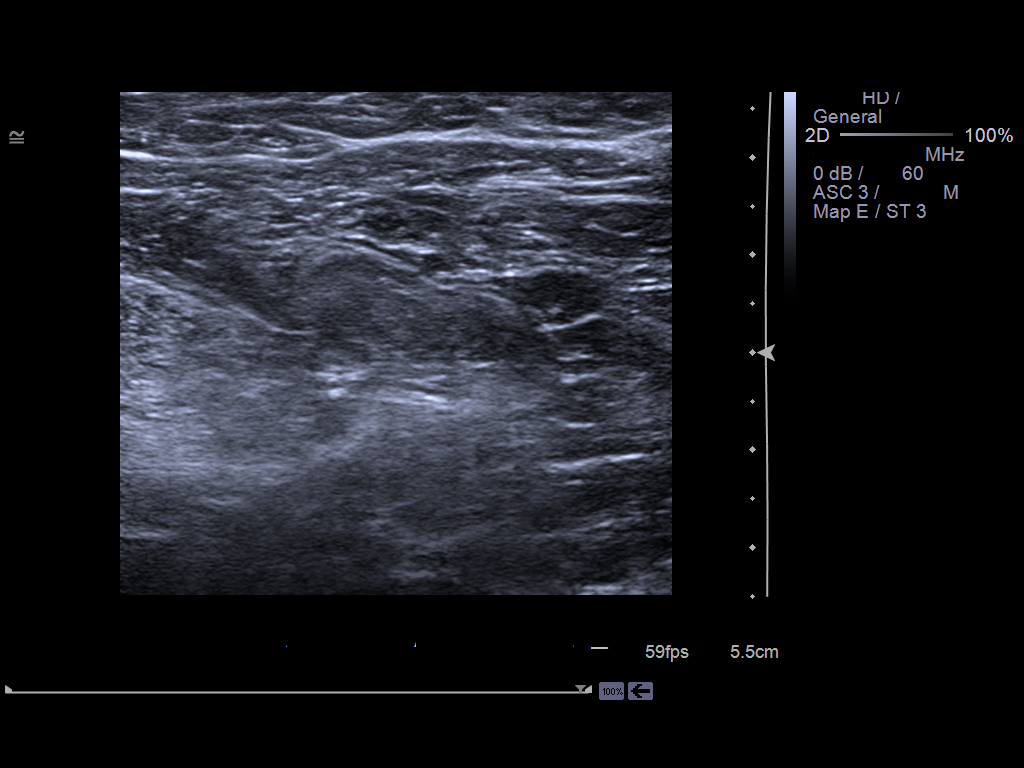

[4 of 4 positions shown; findings below may reference images not displayed]

ACR Breast Density Category c: The breast tissue is heterogeneously
dense, which may obscure small masses.
FINDINGS: Within the lateral left breast posterior depth there is a
circumscribed 1 cm mass with central fatty hilum suggestive of an
intramammary lymph node. No additional concerning masses,
calcifications or architectural distortion identified within the
left breast.

Mammographic images were processed with CAD.

On physical exam, I palpate no discrete mass within the lateral left
breast.

Targeted ultrasound is performed, showing a 9 x 4 x 8 mm benign
appearing intramammary lymph node within the left breast 3 o'clock
position 9 cm from the nipple, felt to correspond with mammographic
abnormality. Additionally within the left breast 2 o'clock position
12 cm from the nipple there is a 1.5 cm benign-appearing
intramammary lymph node.
IMPRESSION: Left breast mass favored to represent an intramammary lymph node.

RECOMMENDATION:
Left breast diagnostic mammography and possible ultrasound in 6
months to demonstrate stability of probable left breast intramammary
lymph node.

I have discussed the findings and recommendations with the patient.
Results were also provided in writing at the conclusion of the
visit. If applicable, a reminder letter will be sent to the patient
regarding the next appointment.

BI-RADS CATEGORY  3: Probably benign.
